# Patient Record
Sex: Male | Born: 1970 | Race: White | Hispanic: No | Marital: Single | Smoking: Former smoker
Health system: Southern US, Community
[De-identification: ages and names within clinical notes are randomized; demographics above are authoritative.]

## PROBLEM LIST (undated history)

## (undated) DIAGNOSIS — E785 Hyperlipidemia, unspecified: Secondary | ICD-10-CM

## (undated) DIAGNOSIS — R519 Headache, unspecified: Secondary | ICD-10-CM

## (undated) DIAGNOSIS — R011 Cardiac murmur, unspecified: Secondary | ICD-10-CM

## (undated) DIAGNOSIS — R0602 Shortness of breath: Secondary | ICD-10-CM

## (undated) DIAGNOSIS — I351 Nonrheumatic aortic (valve) insufficiency: Secondary | ICD-10-CM

## (undated) DIAGNOSIS — M1990 Unspecified osteoarthritis, unspecified site: Secondary | ICD-10-CM

## (undated) DIAGNOSIS — F419 Anxiety disorder, unspecified: Secondary | ICD-10-CM

## (undated) DIAGNOSIS — I350 Nonrheumatic aortic (valve) stenosis: Secondary | ICD-10-CM

## (undated) DIAGNOSIS — Z87442 Personal history of urinary calculi: Secondary | ICD-10-CM

## (undated) DIAGNOSIS — J302 Other seasonal allergic rhinitis: Secondary | ICD-10-CM

## (undated) DIAGNOSIS — M2540 Effusion, unspecified joint: Secondary | ICD-10-CM

## (undated) DIAGNOSIS — Z953 Presence of xenogenic heart valve: Secondary | ICD-10-CM

## (undated) DIAGNOSIS — I429 Cardiomyopathy, unspecified: Secondary | ICD-10-CM

## (undated) HISTORY — DX: Nonrheumatic aortic (valve) insufficiency: I35.1

## (undated) HISTORY — DX: Other seasonal allergic rhinitis: J30.2

## (undated) HISTORY — DX: Headache, unspecified: R51.9

## (undated) HISTORY — PX: LITHOTRIPSY: SUR834

## (undated) HISTORY — DX: Cardiomyopathy, unspecified: I42.9

## (undated) HISTORY — PX: BACK SURGERY: SHX140

## (undated) HISTORY — DX: Anxiety disorder, unspecified: F41.9

## (undated) HISTORY — DX: Nonrheumatic aortic (valve) stenosis: I35.0

## (undated) HISTORY — PX: OTHER SURGICAL HISTORY: SHX169

---

## 2000-12-01 ENCOUNTER — Ambulatory Visit (HOSPITAL_COMMUNITY): Admission: RE | Admit: 2000-12-01 | Discharge: 2000-12-01 | Payer: Self-pay | Admitting: Internal Medicine

## 2003-03-09 ENCOUNTER — Ambulatory Visit (HOSPITAL_BASED_OUTPATIENT_CLINIC_OR_DEPARTMENT_OTHER): Admission: RE | Admit: 2003-03-09 | Discharge: 2003-03-09 | Payer: Self-pay | Admitting: General Surgery

## 2003-12-02 ENCOUNTER — Emergency Department (HOSPITAL_COMMUNITY): Admission: EM | Admit: 2003-12-02 | Discharge: 2003-12-02 | Payer: Self-pay

## 2003-12-09 ENCOUNTER — Emergency Department (HOSPITAL_COMMUNITY): Admission: EM | Admit: 2003-12-09 | Discharge: 2003-12-09 | Payer: Self-pay | Admitting: Emergency Medicine

## 2005-12-04 ENCOUNTER — Encounter: Admission: RE | Admit: 2005-12-04 | Discharge: 2005-12-04 | Payer: Self-pay | Admitting: Internal Medicine

## 2006-07-01 ENCOUNTER — Ambulatory Visit (HOSPITAL_COMMUNITY): Admission: RE | Admit: 2006-07-01 | Discharge: 2006-07-01 | Payer: Self-pay | Admitting: Internal Medicine

## 2008-08-29 ENCOUNTER — Ambulatory Visit (HOSPITAL_COMMUNITY): Admission: RE | Admit: 2008-08-29 | Discharge: 2008-08-29 | Payer: Self-pay | Admitting: Urology

## 2010-05-05 ENCOUNTER — Emergency Department (HOSPITAL_COMMUNITY)
Admission: EM | Admit: 2010-05-05 | Discharge: 2010-05-05 | Disposition: A | Discharge status: Home / Self Care | Payer: BC Managed Care – PPO | Attending: Emergency Medicine | Admitting: Emergency Medicine

## 2010-05-05 ENCOUNTER — Emergency Department (HOSPITAL_COMMUNITY): Payer: BC Managed Care – PPO

## 2010-05-05 DIAGNOSIS — M5124 Other intervertebral disc displacement, thoracic region: Secondary | ICD-10-CM | POA: Insufficient documentation

## 2010-05-05 DIAGNOSIS — R109 Unspecified abdominal pain: Secondary | ICD-10-CM | POA: Insufficient documentation

## 2010-05-05 DIAGNOSIS — IMO0002 Reserved for concepts with insufficient information to code with codable children: Secondary | ICD-10-CM | POA: Insufficient documentation

## 2010-05-05 DIAGNOSIS — Z87442 Personal history of urinary calculi: Secondary | ICD-10-CM | POA: Insufficient documentation

## 2010-05-05 DIAGNOSIS — M549 Dorsalgia, unspecified: Secondary | ICD-10-CM | POA: Insufficient documentation

## 2010-05-05 LAB — POCT I-STAT, CHEM 8
Calcium, Ion: 1.18 mmol/L (ref 1.12–1.32)
Chloride: 108 mEq/L (ref 96–112)
HCT: 48 % (ref 39.0–52.0)
Hemoglobin: 16.3 g/dL (ref 13.0–17.0)
Potassium: 3.6 mEq/L (ref 3.5–5.1)

## 2010-05-05 LAB — URINALYSIS, ROUTINE W REFLEX MICROSCOPIC
Nitrite: NEGATIVE
Specific Gravity, Urine: 1.026 (ref 1.005–1.030)
Urobilinogen, UA: 0.2 mg/dL (ref 0.0–1.0)
pH: 5.5 (ref 5.0–8.0)

## 2010-05-06 LAB — URINE CULTURE
Colony Count: NO GROWTH
Culture  Setup Time: 201203172048

## 2010-07-06 NOTE — Op Note (Signed)
NAME:  CON, ARGANBRIGHT                      ACCOUNT NO.:  [id number]   MEDICAL RECORD NO.:  [id number]                   PATIENT TYPE:  AMB   LOCATION:  DSC                                  FACILITY:  [facility]   PHYSICIAN:  Gita Kudo, M.D.              DATE OF BIRTH:  1970/07/24   DATE OF PROCEDURE:  03/09/2003  DATE OF DISCHARGE:                                 OPERATIVE REPORT   OPERATIVE PROCEDURE:  Excision right buttock and intergluteal pilonidal  sinus.   SURGEON:  Gita Kudo, M.D.   ANESTHESIA:  1% Xylocaine.   PREOPERATIVE DIAGNOSIS:  Buttock lesion - right side.   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   CLINICAL SUMMARY:  40 year old male with a long history of a draining area  on his buttock and also in the midline.   OPERATIVE FINDINGS:  After shaving, I could see the small midline sinus.   OPERATIVE PROCEDURE:  The patient was placed in the prone position, prepped,  draped, and shaved in a standard fashion.  1% Xylocaine was infiltrated for  good analgesia.  A hemostat was placed in the sinus track but did not go  very far.  The indurated area in the right buttock was not fluctuant.  Then,  an elliptical incision was made encompassing the thickened tissue in the  right buttock and there was chronic inflammatory type tissue but no pus or  hair.  This was continued and extended into the midline on the sinus  opening.  As such, a hockey stick shaped incision was made.  Bleeding was  controlled with cautery and dressing.  No complications.  To be followed up  as an outpatient.                                               Gita Kudo, M.D.    MRL/MEDQ  D:  03/09/2003  T:  03/09/2003  Job:  [id number]

## 2011-06-01 ENCOUNTER — Emergency Department (HOSPITAL_COMMUNITY)
Admission: EM | Admit: 2011-06-01 | Discharge: 2011-06-01 | Disposition: A | Discharge status: Home / Self Care | Payer: BC Managed Care – PPO | Attending: Emergency Medicine | Admitting: Emergency Medicine

## 2011-06-01 ENCOUNTER — Encounter (HOSPITAL_COMMUNITY): Payer: Self-pay

## 2011-06-01 DIAGNOSIS — M545 Low back pain, unspecified: Secondary | ICD-10-CM | POA: Insufficient documentation

## 2011-06-01 DIAGNOSIS — X58XXXA Exposure to other specified factors, initial encounter: Secondary | ICD-10-CM | POA: Insufficient documentation

## 2011-06-01 DIAGNOSIS — R109 Unspecified abdominal pain: Secondary | ICD-10-CM | POA: Insufficient documentation

## 2011-06-01 DIAGNOSIS — R10819 Abdominal tenderness, unspecified site: Secondary | ICD-10-CM | POA: Insufficient documentation

## 2011-06-01 DIAGNOSIS — T148XXA Other injury of unspecified body region, initial encounter: Secondary | ICD-10-CM

## 2011-06-01 LAB — DIFFERENTIAL
Basophils Absolute: 0 10*3/uL (ref 0.0–0.1)
Eosinophils Relative: 2 % (ref 0–5)
Lymphocytes Relative: 30 % (ref 12–46)
Lymphs Abs: 2.3 10*3/uL (ref 0.7–4.0)
Monocytes Absolute: 0.7 10*3/uL (ref 0.1–1.0)
Monocytes Relative: 8 % (ref 3–12)
Neutro Abs: 4.7 10*3/uL (ref 1.7–7.7)

## 2011-06-01 LAB — CBC
HCT: 46.6 % (ref 39.0–52.0)
Hemoglobin: 15.8 g/dL (ref 13.0–17.0)
MCV: 88.6 fL (ref 78.0–100.0)
RDW: 13.4 % (ref 11.5–15.5)
WBC: 7.9 10*3/uL (ref 4.0–10.5)

## 2011-06-01 LAB — URINALYSIS, ROUTINE W REFLEX MICROSCOPIC
Glucose, UA: NEGATIVE mg/dL
Hgb urine dipstick: NEGATIVE
Leukocytes, UA: NEGATIVE
Protein, ur: NEGATIVE mg/dL
pH: 6 (ref 5.0–8.0)

## 2011-06-01 LAB — HEPATIC FUNCTION PANEL
Albumin: 4.3 g/dL (ref 3.5–5.2)
Alkaline Phosphatase: 64 U/L (ref 39–117)
Total Protein: 7.8 g/dL (ref 6.0–8.3)

## 2011-06-01 LAB — BASIC METABOLIC PANEL
BUN: 12 mg/dL (ref 6–23)
CO2: 24 mEq/L (ref 19–32)
Calcium: 9.7 mg/dL (ref 8.4–10.5)
Chloride: 105 mEq/L (ref 96–112)
Creatinine, Ser: 0.91 mg/dL (ref 0.50–1.35)
Glucose, Bld: 102 mg/dL — ABNORMAL HIGH (ref 70–99)

## 2011-06-01 MED ORDER — HYDROCODONE-ACETAMINOPHEN 5-325 MG PO TABS: 2.0000 | ORAL_TABLET | ORAL | Status: AC | PRN

## 2011-06-01 MED ORDER — IBUPROFEN 600 MG PO TABS: 600.0000 mg | ORAL_TABLET | Freq: Three times a day (TID) | ORAL | Status: AC | PRN

## 2011-06-01 MED ORDER — KETOROLAC TROMETHAMINE 30 MG/ML IJ SOLN: 30.0000 mg | Freq: Once | INTRAMUSCULAR | Status: DC

## 2011-06-01 MED ORDER — ONDANSETRON HCL 4 MG/2ML IJ SOLN: 4.0000 mg | Freq: Once | INTRAMUSCULAR | Status: DC

## 2011-06-01 MED ORDER — HYDROMORPHONE HCL PF 1 MG/ML IJ SOLN: 1.0000 mg | Freq: Once | INTRAMUSCULAR | Status: DC

## 2011-06-01 NOTE — Discharge Instructions (Signed)
Back Pain, Adult Low back pain is very common. About 1 in 5 people have back pain.The cause of low back pain is rarely dangerous. The pain often gets better over time.About half of people with a sudden onset of back pain feel better in just 2 weeks. About 8 in 10 people feel better by 6 weeks.  CAUSES Some common causes of back pain include:  Strain of the muscles or ligaments supporting the spine.   Wear and tear (degeneration) of the spinal discs.   Arthritis.   Direct injury to the back.  DIAGNOSIS Most of the time, the direct cause of low back pain is not known.However, back pain can be treated effectively even when the exact cause of the pain is unknown.Answering your caregiver's questions about your overall health and symptoms is one of the most accurate ways to make sure the cause of your pain is not dangerous. If your caregiver needs more information, he or she may order lab work or imaging tests (X-rays or MRIs).However, even if imaging tests show changes in your back, this usually does not require surgery. HOME CARE INSTRUCTIONS For many people, back pain returns.Since low back pain is rarely dangerous, it is often a condition that people can learn to [facility] their own.   Remain active. It is stressful on the back to sit or stand in one place. Do not sit, drive, or stand in one place for more than 30 minutes at a time. Take short walks on level surfaces as soon as pain allows.Try to increase the length of time you walk each day.   Do not stay in bed.Resting more than 1 or 2 days can delay your recovery.   Do not avoid exercise or work.Your body is made to move.It is not dangerous to be active, even though your back may hurt.Your back will likely heal faster if you return to being active before your pain is gone.   Pay attention to your body when you bend and lift. Many people have less discomfortwhen lifting if they bend their knees, keep the load close to their  bodies,and avoid twisting. Often, the most comfortable positions are those that put less stress on your recovering back.   Find a comfortable position to sleep. Use a firm mattress and lie on your side with your knees slightly bent. If you lie on your back, put a pillow under your knees.   Only take over-the-counter or prescription medicines as directed by your caregiver. Over-the-counter medicines to reduce pain and inflammation are often the most helpful.Your caregiver may prescribe muscle relaxant drugs.These medicines help dull your pain so you can more quickly return to your normal activities and healthy exercise.   Put ice on the injured area.   Put ice in a plastic bag.   Place a towel between your skin and the bag.   Leave the ice on for 15 to 20 minutes, 3 to 4 times a day for the first 2 to 3 days. After that, ice and heat may be alternated to reduce pain and spasms.   Ask your caregiver about trying back exercises and gentle massage. This may be of some benefit.   Avoid feeling anxious or stressed.Stress increases muscle tension and can worsen back pain.It is important to recognize when you are anxious or stressed and learn ways to manage it.Exercise is a great option.  SEEK MEDICAL CARE IF:  You have pain that is not relieved with rest or medicine.   You have  relieved with rest or medicine.   You have pain that does not improve in 1 week.   You have new symptoms.   You are generally not feeling well.  SEEK IMMEDIATE MEDICAL CARE IF:    You have pain that radiates from your back into your legs.   You develop new bowel or bladder control problems.   You have unusual weakness or numbness in your arms or legs.   You develop nausea or vomiting.   You develop abdominal pain.   You feel faint.  Document Released: 02/04/2005 Document Revised: 01/24/2011 Document Reviewed: 06/25/2010  [employer] Patient Information 2012 [employer]    Lumbosacral Strain  Lumbosacral strain is one of the most common causes of back pain. There are many  causes of back pain. Most are not serious conditions.  CAUSES   Your backbone (spinal column) is made up of 24 main vertebral bodies, the sacrum, and the coccyx. These are held together by muscles and tough, fibrous tissue (ligaments). Nerve roots pass through the openings between the vertebrae. A sudden move or injury to the back may cause injury to, or pressure on, these nerves. This may result in localized back pain or pain movement (radiation) into the buttocks, down the leg, and into the foot. Sharp, shooting pain from the buttock down the back of the leg (sciatica) is frequently associated with a ruptured (herniated) disk. Pain may be caused by muscle spasm alone.  Your caregiver can often find the cause of your pain by the details of your symptoms and an exam. In some cases, you may need tests (such as X-rays). Your caregiver will work with you to decide if any tests are needed based on your specific exam.  HOME CARE INSTRUCTIONS    Avoid an underactive lifestyle. Active exercise, as directed by your caregiver, is your greatest weapon against back pain.   Avoid hard physical activities (tennis, racquetball, waterskiing) if you are not in proper physical condition for it. This may aggravate or create problems.   If you have a back problem, avoid sports requiring sudden body movements. Swimming and walking are generally safer activities.   Maintain good posture.   Avoid becoming overweight (obese).   Use bed rest for only the most extreme, sudden (acute) episode. Your caregiver will help you determine how much bed rest is necessary.   For acute conditions, you may put ice on the injured area.   Put ice in a plastic bag.   Place a towel between your skin and the bag.   Leave the ice on for 15 to 20 minutes at a time, every 2 hours, or as needed.   After you are improved and more active, it may help to apply heat for 30 minutes before activities.  See your caregiver if you are having pain that lasts  longer than expected. Your caregiver can advise appropriate exercises or therapy if needed. With conditioning, most back problems can be avoided.  SEEK IMMEDIATE MEDICAL CARE IF:    You have numbness, tingling, weakness, or problems with the use of your arms or legs.   You experience severe back pain not relieved with medicines.   There is a change in bowel or bladder control.   You have increasing pain in any area of the body, including your belly (abdomen).   You notice shortness of breath, dizziness, or feel faint.   You feel sick to your stomach (nauseous), are throwing up (vomiting), or become sweaty.   You   You have a fever.  MAKE SURE YOU:   Understand these instructions.   Will watch your condition.   Will get help right away if you are not doing well or get worse.  Document Released: 11/14/2004 Document Revised: 01/24/2011 Document Reviewed: 05/06/2008 [facility] Patient Information 2012 [place], [place].Pain of Unknown Etiology (Pain Without a Known Cause) You have come to your caregiver because of pain. Pain can occur in any part of the body. Often there is not a definite cause. If your laboratory (blood or urine) work was normal and x-rays or other studies were normal, your caregiver may treat you without knowing the cause of the pain. An example of this is the headache. Most headaches are diagnosed by taking a history. This means your caregiver asks you questions about your headaches. Your caregiver determines a treatment based on your answers. Usually testing done for headaches is normal. Often testing is not done unless there is no response to medications. Regardless of where your pain is located today, you can be given medications to make you comfortable. If no physical cause of pain can be found, most cases of pain will gradually leave as  suddenly as they came.  If you have a painful condition and no reason can be found for the pain, It is importantthat you follow up with your caregiver. If the pain becomes worse or does not go away, it may be necessary to repeat tests and look further for a possible cause.  Only take over-the-counter or prescription medicines for pain, discomfort, or fever as directed by your caregiver.   For the protection of your privacy, test results can not be given over the phone. Make sure you receive the results of your test. Ask as to how these results are to be obtained if you have not been informed. It is your responsibility to obtain your test results.   You may continue all activities unless the activities cause more pain. When the pain lessens, it is important to gradually resume normal activities. Resume activities by beginning slowly and gradually increasing the intensity and duration of the activities or exercise. During periods of severe pain, bed-rest may be helpful. Lay or sit in any position that is comfortable.   Ice used for acute (sudden) conditions may be effective. Use a large plastic bag filled with ice and wrapped in a towel. This may provide pain relief.   See your caregiver for continued problems. They can help or refer you for exercises or physical therapy if necessary.  If you were given medications for your condition, do not drive, operate machinery or power tools, or sign legal documents for 24 hours. Do not drink alcohol, take sleeping pills, or take other medications that may interfere with treatment. See your caregiver immediately if you have pain that is becoming worse and not relieved by medications. Document Released: 10/30/2000 Document Revised: 01/24/2011 Document Reviewed: 02/04/2005 [facility] Patient Information 2012 [place], [place].Pain of Unknown Etiology (Pain Without a Known Cause) You have come to your caregiver because of pain. Pain can occur in any part of the body.  Often there is not a definite cause. If your laboratory (blood or urine) work was normal and x-rays or other studies were normal, your caregiver may treat you without knowing the cause of the pain. An example of this is the headache. Most headaches are diagnosed by taking a history. This means your caregiver asks you questions about your headaches. Your caregiver determines a treatment based on your answers. Usually  testing done for headaches is normal. Often testing is not done unless there is no response to medications. Regardless of where your pain is located today, you can be given medications to make you comfortable. If no physical cause of pain can be found, most cases of pain will gradually leave as suddenly as they came.  If you have a painful condition and no reason can be found for the pain, It is importantthat you follow up with your caregiver. If the pain becomes worse or does not go away, it may be necessary to repeat tests and look further for a possible cause.  Only take over-the-counter or prescription medicines for pain, discomfort, or fever as directed by your caregiver.   For the protection of your privacy, test results can not be given over the phone. Make sure you receive the results of your test. Ask as to how these results are to be obtained if you have not been informed. It is your responsibility to obtain your test results.   You may continue all activities unless the activities cause more pain. When the pain lessens, it is important to gradually resume normal activities. Resume activities by beginning slowly and gradually increasing the intensity and duration of the activities or exercise. During periods of severe pain, bed-rest may be helpful. Lay or sit in any position that is comfortable.   Ice used for acute (sudden) conditions may be effective. Use a large plastic bag filled with ice and wrapped in a towel. This may provide pain relief.   See your caregiver for continued  problems. They can help or refer you for exercises or physical therapy if necessary.  If you were given medications for your condition, do not drive, operate machinery or power tools, or sign legal documents for 24 hours. Do not drink alcohol, take sleeping pills, or take other medications that may interfere with treatment. See your caregiver immediately if you have pain that is becoming worse and not relieved by medications. Document Released: 10/30/2000 Document Revised: 01/24/2011 Document Reviewed: 02/04/2005 [facility] Patient Information 2012 [place], [place]. RESOURCE GUIDE  Dental Problems  Patients with Medicaid: [facility] [phone] [address]                                           [phone] [address] Phone:  [phone]                                                  Phone:  [phone]  If unable to pay or uninsured, contact:  [facility] or [facility] to become qualified for the adult dental clinic.  Chronic Pain Problems Contact [facility]  [phone] Patients need to be referred by their primary care doctor.  Insufficient Money for Medicine Contact [employer]:  call "211" or [facility] 206-686-3825.  No Primary Care Doctor Call [facility]  [phone] Other agencies that provide inexpensive medical care    [facility]  [phone]    [facility]  [phone]    [facility]  ([phone]  Women's Clinic  [phone]    [facility]  (608) [phone]    [facility]  [phone]  Psychological Services [facility]  [phone] [facility]  [phone] [facility]   [phone] (emergency services [phone])  Substance Abuse Resources [facility]  639-115-2662 [facility] 616-282-9347 [facility] [place] ([phone] Floydene Flock [phone] Residential & Outpatient Substance  Abuse Program  610-084-6581  Abuse/Neglect [facility] Child Abuse Hotline (513)834-6764 [facility] Child Abuse Hotline 867 225 9641 (After Hours)  Emergency Shelter [facility] [phone]  Maternity Homes [facility] at the [place] [facility] [phone] [facility] [phone]  MRSA Hotline #:   581-212-3692    [facility] Resources  [facility] [place]     [employer]                          [facility] Dept. [address] [place]                       [address]      [address]  Blondell Reveal Phone:  [phone]                                   Phone:  [phone]                 Phone:  [phone]  [facility] Phone:  [phone]  [facility] Child Abuse Hotline 365-475-1832 (303) [phone] (After Hours)

## 2011-06-01 NOTE — ED Notes (Signed)
Pt states pain in low right flank x 1 wk.  Hx of kidney stones.  No difficulty urinating.

## 2011-06-01 NOTE — ED Notes (Signed)
Pt refused IV pain meds.  

## 2011-06-01 NOTE — ED Provider Notes (Signed)
History     CSN: [id number]  Arrival date & time 06/01/11  1015   First MD Initiated Contact with Patient 06/01/11 1043      Chief Complaint  Patient presents with  . Flank Pain    right side x1 week    (Consider location/radiation/quality/duration/timing/severity/associated sxs/prior treatment) Patient is a 41 y.o. male presenting with flank pain. The history is provided by the patient and medical records.  Flank Pain This is a recurrent problem. The current episode started more than 1 week ago. The problem occurs constantly. The problem has not changed since onset.Associated symptoms include abdominal pain. Pertinent negatives include no chest pain, no headaches and no shortness of breath. The symptoms are aggravated by nothing (Not worsened or improved by urination, eating, bowel movement, or certain positions.). The symptoms are relieved by nothing. He has tried nothing for the symptoms.    History reviewed. No pertinent past medical history.  Past Surgical History  Procedure Date  . Lithotripsy     No family history on file.  History  Substance Use Topics  . Smoking status: Current Some Day Smoker  . Smokeless tobacco: Not on file  . Alcohol Use: Yes      Review of Systems  Constitutional: Negative for fever, chills, activity change, appetite change and fatigue.  HENT: Negative.   Eyes: Negative.   Respiratory: Negative for cough, shortness of breath and wheezing.   Cardiovascular: Negative for chest pain.  Gastrointestinal: Positive for abdominal pain. Negative for nausea, vomiting, diarrhea, constipation, blood in stool, abdominal distention, anal bleeding and rectal pain.  Genitourinary: Positive for flank pain. Negative for dysuria, urgency, frequency, hematuria, decreased urine volume, discharge, genital sores, penile pain and testicular pain.  Musculoskeletal: Negative.   Skin: Negative for color change and rash.  Neurological: Negative for light-headedness  and headaches.  Hematological: Does not bruise/bleed easily.  Psychiatric/Behavioral: Negative.     Allergies  Prednisone and Serzone  Home Medications   Current Outpatient Rx  Name Route Sig Dispense Refill  . ALPRAZOLAM 0.5 MG PO TABS Oral Take 0.5 mg by mouth at bedtime as needed. anxiety    . FLUTICASONE PROPIONATE 50 MCG/ACT NA SUSP Nasal Place 2 sprays into the nose daily.    . IBUPROFEN 200 MG PO TABS Oral Take 200 mg by mouth every 6 (six) hours as needed. pain    . HYDROCODONE-ACETAMINOPHEN 5-325 MG PO TABS Oral Take 2 tablets by mouth every 4 (four) hours as needed for pain. 20 tablet 0  . IBUPROFEN 600 MG PO TABS Oral Take 1 tablet (600 mg total) by mouth every 8 (eight) hours as needed for pain. 20 tablet 0    BP 131/88  Pulse 76  Temp(Src) 97.8 F (36.6 C) (Oral)  Resp 18  SpO2 99%  Physical Exam  Nursing note and vitals reviewed. Constitutional: He is oriented to person, place, and time. He appears well-nourished. No distress.  HENT:  Head: Normocephalic and atraumatic.  Eyes: EOM are normal. Pupils are equal, round, and reactive to light.  Neck: Normal range of motion. Neck supple. No JVD present.  Cardiovascular: Normal rate, regular rhythm, normal heart sounds and intact distal pulses.  Exam reveals no gallop and no friction rub.   No murmur heard. Pulmonary/Chest: Effort normal and breath sounds normal. No respiratory distress. He has no wheezes. He has no rales. He exhibits no tenderness.  Abdominal: Soft. Bowel sounds are normal. He exhibits no distension, no fluid wave, no ascites and no  mass. There is no hepatosplenomegaly. There is tenderness in the right upper quadrant, right lower quadrant, left upper quadrant and left lower quadrant. There is CVA tenderness. There is no rigidity, no rebound, no guarding, no tenderness at McBurney's point and negative Murphy's sign.       Mild right CVA tenderness to palpation  Musculoskeletal: Normal range of motion. He  exhibits no edema and no tenderness.       Lumbar back: He exhibits tenderness and pain. He exhibits normal range of motion, no bony tenderness, no deformity and no spasm.       Back:  Neurological: He is alert and oriented to person, place, and time. He has normal strength and normal reflexes. He displays no tremor. No cranial nerve deficit or sensory deficit. He exhibits normal muscle tone. Coordination and gait normal. GCS eye subscore is 4. GCS verbal subscore is 5. GCS motor subscore is 6.  Reflex Scores:      Patellar reflexes are 2+ on the right side and 2+ on the left side. Skin: Skin is warm and dry. No rash noted. He is not diaphoretic. No erythema. No pallor.  Psychiatric: He has a normal mood and affect. His behavior is normal. Judgment and thought content normal.    ED Course  Procedures (including critical care time)  Labs Reviewed  BASIC METABOLIC PANEL - Abnormal; Notable for the following:    Glucose, Bld 102 (*)    All other components within normal limits  URINALYSIS, ROUTINE W REFLEX MICROSCOPIC  CBC  DIFFERENTIAL  LIPASE, BLOOD  HEPATIC FUNCTION PANEL  URINE CULTURE   No results found.   1. Low back pain   2. Muscle strain       MDM  Renal colic, urinary tract infection, muscle strain of low back, biliary colic (cholecystitis/cholelithiasis), pancreatitis all entertained in the differential diagnosis.  History/exam not suggestive of bowel obstruction or appendicitis.  Review of diagnostic testing is suggestive of lumbar strain/muscular lumbar pain.         Felisa Bonier, MD 06/01/11 Mikle Bosworth

## 2011-06-01 NOTE — ED Notes (Signed)
Pt in from home with right flank pain x1 week denies difficulty urinating hx of kidney stones

## 2011-06-02 LAB — URINE CULTURE
Colony Count: NO GROWTH
Culture: NO GROWTH

## 2012-08-05 ENCOUNTER — Encounter (INDEPENDENT_AMBULATORY_CARE_PROVIDER_SITE_OTHER): Payer: BC Managed Care – PPO

## 2012-08-05 DIAGNOSIS — R42 Dizziness and giddiness: Secondary | ICD-10-CM

## 2012-08-05 DIAGNOSIS — H35029 Exudative retinopathy, unspecified eye: Secondary | ICD-10-CM

## 2012-08-05 DIAGNOSIS — H547 Unspecified visual loss: Secondary | ICD-10-CM

## 2012-08-07 ENCOUNTER — Telehealth: Payer: Self-pay | Admitting: Neurology

## 2012-08-07 NOTE — Telephone Encounter (Signed)
I  received a phone call from Dr.  Link Snuffer, patient had MRI recently, consistent with MS.  1. Annabelle Harman, please contact him for an appointment in 1-2 weeks. 2. Advise him to get MRI CD from [facility].

## 2012-08-10 NOTE — Telephone Encounter (Signed)
Called patient and spoke to him he will see Dr.Yan this coming Wednesday and he will bring MRI cd's.

## 2012-08-12 ENCOUNTER — Ambulatory Visit: Payer: Self-pay | Admitting: Neurology

## 2012-08-12 ENCOUNTER — Ambulatory Visit (HOSPITAL_COMMUNITY): Payer: BC Managed Care – PPO | Attending: Cardiovascular Disease | Admitting: Radiology

## 2012-08-12 ENCOUNTER — Other Ambulatory Visit (HOSPITAL_COMMUNITY): Payer: Self-pay | Admitting: Internal Medicine

## 2012-08-12 DIAGNOSIS — I379 Nonrheumatic pulmonary valve disorder, unspecified: Secondary | ICD-10-CM | POA: Insufficient documentation

## 2012-08-12 DIAGNOSIS — Q231 Congenital insufficiency of aortic valve: Secondary | ICD-10-CM | POA: Insufficient documentation

## 2012-08-12 DIAGNOSIS — I080 Rheumatic disorders of both mitral and aortic valves: Secondary | ICD-10-CM | POA: Insufficient documentation

## 2012-08-12 DIAGNOSIS — R42 Dizziness and giddiness: Secondary | ICD-10-CM | POA: Insufficient documentation

## 2012-08-12 DIAGNOSIS — Q2381 Bicuspid aortic valve: Secondary | ICD-10-CM

## 2012-08-12 DIAGNOSIS — H5460 Unqualified visual loss, one eye, unspecified: Secondary | ICD-10-CM | POA: Insufficient documentation

## 2012-08-12 DIAGNOSIS — I359 Nonrheumatic aortic valve disorder, unspecified: Secondary | ICD-10-CM

## 2012-08-12 NOTE — Progress Notes (Signed)
Echocardiogram performed.  

## 2012-08-26 ENCOUNTER — Encounter (HOSPITAL_COMMUNITY): Payer: Self-pay | Admitting: Pharmacy Technician

## 2012-09-01 ENCOUNTER — Ambulatory Visit (HOSPITAL_COMMUNITY)
Admission: RE | Admit: 2012-09-01 | Discharge: 2012-09-01 | Disposition: A | Discharge status: Home / Self Care | Payer: BC Managed Care – PPO | Source: Ambulatory Visit | Attending: Cardiology | Admitting: Cardiology

## 2012-09-01 ENCOUNTER — Encounter (HOSPITAL_COMMUNITY)
Admission: RE | Disposition: A | Discharge status: Home / Self Care | Payer: Self-pay | Source: Ambulatory Visit | Attending: Cardiology

## 2012-09-01 ENCOUNTER — Encounter (HOSPITAL_COMMUNITY): Payer: Self-pay | Admitting: *Deleted

## 2012-09-01 DIAGNOSIS — I359 Nonrheumatic aortic valve disorder, unspecified: Secondary | ICD-10-CM | POA: Insufficient documentation

## 2012-09-01 DIAGNOSIS — F411 Generalized anxiety disorder: Secondary | ICD-10-CM | POA: Insufficient documentation

## 2012-09-01 DIAGNOSIS — I428 Other cardiomyopathies: Secondary | ICD-10-CM | POA: Insufficient documentation

## 2012-09-01 DIAGNOSIS — Z79899 Other long term (current) drug therapy: Secondary | ICD-10-CM | POA: Insufficient documentation

## 2012-09-01 DIAGNOSIS — Z7982 Long term (current) use of aspirin: Secondary | ICD-10-CM | POA: Insufficient documentation

## 2012-09-01 DIAGNOSIS — J309 Allergic rhinitis, unspecified: Secondary | ICD-10-CM | POA: Insufficient documentation

## 2012-09-01 DIAGNOSIS — Z808 Family history of malignant neoplasm of other organs or systems: Secondary | ICD-10-CM | POA: Insufficient documentation

## 2012-09-01 DIAGNOSIS — F17200 Nicotine dependence, unspecified, uncomplicated: Secondary | ICD-10-CM | POA: Insufficient documentation

## 2012-09-01 HISTORY — PX: TEE WITHOUT CARDIOVERSION: SHX5443

## 2012-09-01 HISTORY — DX: Cardiac murmur, unspecified: R01.1

## 2012-09-01 SURGERY — ECHOCARDIOGRAM, TRANSESOPHAGEAL
Anesthesia: Moderate Sedation

## 2012-09-01 MED ORDER — DIPHENHYDRAMINE HCL 50 MG/ML IJ SOLN
INTRAMUSCULAR | Status: AC
  Filled 2012-09-01: qty 1

## 2012-09-01 MED ORDER — PROMETHAZINE HCL 25 MG/ML IJ SOLN
INTRAMUSCULAR | Status: DC | PRN
  Administered 2012-09-01: 12.5 mg via INTRAVENOUS

## 2012-09-01 MED ORDER — SODIUM CHLORIDE 0.9 % IV SOLN
INTRAVENOUS | Status: DC
  Administered 2012-09-01: 14:00:00 via INTRAVENOUS

## 2012-09-01 MED ORDER — BUTAMBEN-TETRACAINE-BENZOCAINE 2-2-14 % EX AERO
INHALATION_SPRAY | CUTANEOUS | Status: DC | PRN
  Administered 2012-09-01: 2 via TOPICAL

## 2012-09-01 MED ORDER — FENTANYL CITRATE 0.05 MG/ML IJ SOLN
INTRAMUSCULAR | Status: DC | PRN
  Administered 2012-09-01: 50 ug via INTRAVENOUS

## 2012-09-01 MED ORDER — FENTANYL CITRATE 0.05 MG/ML IJ SOLN
INTRAMUSCULAR | Status: AC
  Filled 2012-09-01: qty 2

## 2012-09-01 MED ORDER — MIDAZOLAM HCL 5 MG/ML IJ SOLN
INTRAMUSCULAR | Status: AC
  Filled 2012-09-01: qty 2

## 2012-09-01 MED ORDER — PROMETHAZINE HCL 25 MG/ML IJ SOLN
INTRAMUSCULAR | Status: AC
  Filled 2012-09-01: qty 1

## 2012-09-01 MED ORDER — MIDAZOLAM HCL 10 MG/2ML IJ SOLN
INTRAMUSCULAR | Status: DC | PRN
  Administered 2012-09-01 (×2): 2 mg via INTRAVENOUS
  Administered 2012-09-01: 1 mg via INTRAVENOUS

## 2012-09-01 NOTE — Interval H&P Note (Signed)
History and Physical Interval Note:  09/01/2012 1:22 PM  Bruce [name]  has presented today for surgery, with the diagnosis of AORTIC STENOSIS  The various methods of treatment have been discussed with the patient and family. After consideration of risks, benefits and other options for treatment, the patient has consented to  Procedure(s) with comments: TRANSESOPHAGEAL ECHOCARDIOGRAM (TEE) (N/A) - h&p in file-[facility] as a surgical intervention .  The patient's history has been reviewed, patient examined, no change in status, stable for surgery.  I have reviewed the patient's chart and labs.  Questions were answered to the patient's satisfaction.     Pamella Pert

## 2012-09-01 NOTE — Progress Notes (Signed)
*  PRELIMINARY RESULTS* Echocardiogram TEE has been performed.  Bruce [name] 09/01/2012, 3:31 PM

## 2012-09-01 NOTE — H&P (Signed)
  Please see office visit notes for complete details of HPI.  

## 2012-09-01 NOTE — CV Procedure (Signed)
Probably bicuspid aortic valve with severe AS. LV mildly dilated with severe LV systolic dysfunction. EF 30-35%.

## 2012-09-02 ENCOUNTER — Encounter (HOSPITAL_COMMUNITY): Payer: Self-pay | Admitting: Cardiology

## 2012-09-08 ENCOUNTER — Encounter (HOSPITAL_COMMUNITY)
Admission: RE | Disposition: A | Discharge status: Home / Self Care | Payer: Self-pay | Source: Ambulatory Visit | Attending: Cardiology

## 2012-09-08 ENCOUNTER — Ambulatory Visit (HOSPITAL_COMMUNITY)
Admission: RE | Admit: 2012-09-08 | Discharge: 2012-09-08 | Disposition: A | Discharge status: Home / Self Care | Payer: BC Managed Care – PPO | Source: Ambulatory Visit | Attending: Cardiology | Admitting: Cardiology

## 2012-09-08 DIAGNOSIS — I519 Heart disease, unspecified: Secondary | ICD-10-CM | POA: Insufficient documentation

## 2012-09-08 DIAGNOSIS — Q231 Congenital insufficiency of aortic valve: Secondary | ICD-10-CM | POA: Insufficient documentation

## 2012-09-08 DIAGNOSIS — I428 Other cardiomyopathies: Secondary | ICD-10-CM | POA: Insufficient documentation

## 2012-09-08 HISTORY — PX: LEFT AND RIGHT HEART CATHETERIZATION WITH CORONARY ANGIOGRAM: SHX5449

## 2012-09-08 LAB — POCT I-STAT 3, VENOUS BLOOD GAS (G3P V)
Bicarbonate: 24.3 mEq/L — ABNORMAL HIGH (ref 20.0–24.0)
pCO2, Ven: 42.6 mmHg — ABNORMAL LOW (ref 45.0–50.0)
pH, Ven: 7.38 — ABNORMAL HIGH (ref 7.250–7.300)
pH, Ven: 7.382 — ABNORMAL HIGH (ref 7.250–7.300)
pO2, Ven: 36 mmHg (ref 30.0–45.0)

## 2012-09-08 LAB — POCT I-STAT 3, ART BLOOD GAS (G3+)
O2 Saturation: 97 %
TCO2: 24 mmol/L (ref 0–100)
pCO2 arterial: 36.4 mmHg (ref 35.0–45.0)
pO2, Arterial: 91 mmHg (ref 80.0–100.0)

## 2012-09-08 SURGERY — LEFT AND RIGHT HEART CATHETERIZATION WITH CORONARY ANGIOGRAM
Anesthesia: LOCAL

## 2012-09-08 MED ORDER — SODIUM CHLORIDE 0.9 % IV SOLN: 1.0000 mL/kg/h | INTRAVENOUS | Status: DC

## 2012-09-08 MED ORDER — HEPARIN (PORCINE) IN NACL 2-0.9 UNIT/ML-% IJ SOLN
INTRAMUSCULAR | Status: AC
  Filled 2012-09-08: qty 1000

## 2012-09-08 MED ORDER — HYDROMORPHONE HCL PF 2 MG/ML IJ SOLN
INTRAMUSCULAR | Status: AC
  Filled 2012-09-08: qty 1

## 2012-09-08 MED ORDER — SODIUM CHLORIDE 0.9 % IV SOLN: 250.0000 mL | INTRAVENOUS | Status: DC | PRN

## 2012-09-08 MED ORDER — ONDANSETRON HCL 4 MG/2ML IJ SOLN: 4.0000 mg | Freq: Four times a day (QID) | INTRAMUSCULAR | Status: DC | PRN

## 2012-09-08 MED ORDER — MIDAZOLAM HCL 2 MG/2ML IJ SOLN
INTRAMUSCULAR | Status: AC
  Filled 2012-09-08: qty 2

## 2012-09-08 MED ORDER — HEPARIN SODIUM (PORCINE) 1000 UNIT/ML IJ SOLN
INTRAMUSCULAR | Status: AC
  Filled 2012-09-08: qty 1

## 2012-09-08 MED ORDER — SODIUM CHLORIDE 0.9 % IJ SOLN: 3.0000 mL | Freq: Two times a day (BID) | INTRAMUSCULAR | Status: DC

## 2012-09-08 MED ORDER — VERAPAMIL HCL 2.5 MG/ML IV SOLN
INTRAVENOUS | Status: AC
  Filled 2012-09-08: qty 2

## 2012-09-08 MED ORDER — SODIUM CHLORIDE 0.9 % IJ SOLN: 3.0000 mL | INTRAMUSCULAR | Status: DC | PRN

## 2012-09-08 MED ORDER — ASPIRIN 81 MG PO CHEW
324.0000 mg | CHEWABLE_TABLET | ORAL | Status: AC
Start: 2012-09-09 — End: 2012-09-08
  Administered 2012-09-08: 324 mg via ORAL
  Filled 2012-09-08: qty 4

## 2012-09-08 MED ORDER — NITROGLYCERIN 0.2 MG/ML ON CALL CATH LAB
INTRAVENOUS | Status: AC
  Filled 2012-09-08: qty 1

## 2012-09-08 MED ORDER — SODIUM CHLORIDE 0.9 % IV SOLN
INTRAVENOUS | Status: DC
  Administered 2012-09-08: 10:00:00 via INTRAVENOUS

## 2012-09-08 MED ORDER — LIDOCAINE HCL (PF) 1 % IJ SOLN
INTRAMUSCULAR | Status: AC
  Filled 2012-09-08: qty 30

## 2012-09-08 MED ORDER — ACETAMINOPHEN 325 MG PO TABS: 650.0000 mg | ORAL_TABLET | ORAL | Status: DC | PRN

## 2012-09-08 NOTE — H&P (View-Only) (Signed)
  Please see office visit notes for complete details of HPI.  

## 2012-09-08 NOTE — CV Procedure (Signed)
Procedures performed: Right and left heart catheterization and occlusion of cardiac output and cardiac index by Fick. Right radial arterial access for left heart catheterization and right antecubital vein access for right heart catheterization was utilized for performing the procedure.   Indication: Patient is a 42 year-old , male with recently diagnosed cardiomyopathy with severe LV systolic dysfunction, severe aortic stenosis, bicuspid aortic valve.  Due to symptomatic severe aortic stenosis, patient underwent TEE, which confirmed severe bicuspid aortic valve stenosis and mild to moderate aortic regurgitation.  Hence was scheduled for left and right heart catheterization for preoperative cardiac evaluation.  Ascending aortogram and descending thoracic aortogram was performed to evaluate for presence of aneurysm, coarctation of the aorta, to evaluate surgical feasibility for lateral thoracotomy approach for aortic valve replacement.  Procedural data:  RA pressure 10/9  Mean 7 mm mercury. RA saturation 67%.  RV pressure 25/5 and Right ventricular EDP 9 mm Hg. PA pressure 21/12 with a mean of 16  mm mercury. PA saturation 70%.  Pulmonary capillary wedge 15/15 with a mean of 13 mm Hg. Aortic saturation 97%.  Cardiac output was 4.43 with cardiac index of 2.01  by Fick.  Aortic valve area cavitated at 1.12 cm2.  Peak to peak gradient was 44 mmHg, mean gradient was 25.5 mmHg.  Left heart catheterization hemodynamic data: Left ventricle pressure 156/9 with end-diastolic pressure of 15 mmHg.aortic pressure was 131/75 with a mean of 99 mmHg. Aortic valve area cavitated at 1.12 cm2.  Peak to peak gradient was 44 mmHg, mean gradient was 25.5 mmHg.  Angiographic data  Left ventricle:  performed.  Left systolic shows normal ejection fraction of 35-40% The left ventricle is dilated moderately.  Right coronary artery: Smooth, dominant and normal. Large proximal RV branch noted.  Left main coronary artery:  Normal. No stenosis.   LAD: Large, smooth and normal. Gives origin to a moderate sized diagonal 1 which is smooth and normal.   Circumflex coronary artery: Smooth normal. Non-dominant.  Ascending aortogram/ascending thoracic aortogram: Moderate aortic valve calcification evident.  Moderate aortic regurgitation. Descending thoracic and abdominal aorta reveals no evidence of abdominal aortic aneurysm or aortic coarctation.  Impression: Normal left and right  heart catheterizaton with normal coronary arteries.  Moderate aortic regurgitation, moderate aortic stenosis with moderate to severe LV systolic dysfunction, LV dilatation.  Congenital bicuspid aortic valve without any evidence of ascending or descending thoracic or abdominal aortic aneurysm.  Recommendation:  As patient has left ventricular systolic dysfunction, patient needs identified replacement.  Patient will be referred for cardio-thoracic surgical consultation.  Technique:  A 5 French brachial sheath introduced into right AC vein access. A 5 French Swan-Ganz catheter was advanced with balloon inflated on the sheath under fluoroscopic guidance into first the right atrium followed by the right ventricle and into the pulmonary artery to pulmonary artery wedge position. Hemodynamics were obtained in a locations. After hemodynamics were completed, samples were taken for SaO2% measurement to be used in [facility] /Index catheterization. The catheter was then pulled back the balloon down and then completely out of the body.   Left Heart Catheterization   First a 5 [place] TIG 4 catheter was advanced over standard J-wire into the ascending aorta and used to engage first the Left and Right Coronary Artery. Multiple cineangiographic views of the Left then Right Coronary Artery system(s) were performed.  A 5  French angled pigtail catheter which was used to cross the aortic valve for measurement Left Ventricular Hemodynamics. I utilized a 0.035 inch  versacore wire for crossing the aortic valve. Left ventriculography was then performed in the RAO projection. Hemodynamics were then resampled and the catheter pulled back across the aortic valve for measurement of "pullback" gradient. The catheter was then removed the body over wire. All exchanges were made over standard J wire. Hemostasis was obtained by applying TR band on the right radial arterial access and manual pressure was held at the venous access site.

## 2012-09-08 NOTE — Interval H&P Note (Signed)
History and Physical Interval Note:  09/08/2012 11:42 AM  Bruce [name]  has presented today for surgery, with the diagnosis of Chest pain  The various methods of treatment have been discussed with the patient and family. After consideration of risks, benefits and other options for treatment, the patient has consented to  Procedure(s): LEFT AND RIGHT HEART CATHETERIZATION WITH CORONARY ANGIOGRAM (N/A) as a surgical intervention .  The patient's history has been reviewed, patient examined, no change in status, stable for surgery.  I have reviewed the patient's chart and labs.  Questions were answered to the patient's satisfaction.     Pamella Pert

## 2012-09-10 ENCOUNTER — Encounter: Payer: Self-pay | Admitting: Thoracic Surgery (Cardiothoracic Vascular Surgery)

## 2012-09-10 ENCOUNTER — Other Ambulatory Visit: Payer: Self-pay | Admitting: *Deleted

## 2012-09-10 ENCOUNTER — Institutional Professional Consult (permissible substitution) (INDEPENDENT_AMBULATORY_CARE_PROVIDER_SITE_OTHER): Payer: BC Managed Care – PPO | Admitting: Thoracic Surgery (Cardiothoracic Vascular Surgery)

## 2012-09-10 VITALS — BP 113/80 | HR 88 | Resp 20 | Ht 72.0 in | Wt 215.0 lb

## 2012-09-10 DIAGNOSIS — I351 Nonrheumatic aortic (valve) insufficiency: Secondary | ICD-10-CM

## 2012-09-10 DIAGNOSIS — I359 Nonrheumatic aortic valve disorder, unspecified: Secondary | ICD-10-CM

## 2012-09-10 DIAGNOSIS — I350 Nonrheumatic aortic (valve) stenosis: Secondary | ICD-10-CM

## 2012-09-10 NOTE — Progress Notes (Signed)
[address]       [name] [address]             [phone]     CARDIOTHORACIC SURGERY CONSULTATION REPORT  Referring Provider is Pamella Pert, MD PCP is Hoyle Sauer, MD  Chief Complaint  Patient presents with  . Aortic Stenosis    surgical eval, TEE 09/01/12, Cardiac Cath 09/08/12     HPI:  Patient is a 42 year old single white male referred by Dr. Jacinto Halim for possible surgical treatment of bicuspid aortic valve with aortic stenosis and aortic regurgitation.  The patient states that he was first noted to have a heart murmur on physical exam while he was in the [employer] in 1989. 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  He currently works as an Airline pilot and lives in [place], [place], although he also has a second home locally in [place]. He has been followed for several years by Dr. Felipa Eth who has been checking routine follow up echocardiograms every 2-3 years.  A followup transthoracic echocardiogram was performed last month that suggested the patient's aortic stenosis had progressed to the point of becoming severe with peak velocity across the valve approaching 4 m/s corresponding to peak and mean transvalvular gradients estimated to be 61 and 40 mm mercury respectively.  Left ventricular ejection fraction was estimated 40%. The patient was referred to Dr. Jacinto Halim who performed both TEE and left and right heart catheterization.  TEE confirmed the presence of what appeared to be a bicuspid aortic valve with at least moderate aortic stenosis and moderate aortic regurgitation. There was at least moderate global left ventricular systolic dysfunction with ejection fraction estimated 25-30%. Left and right heart catheterization was notable for the absence of significant coronary artery disease. There was moderate aortic stenosis with mean gradient across the aortic valve by cath measured 25.5 mm  mercury.  There was moderate aortic insufficiency and the thoracic aorta appeared relatively normal sized. Pulmonary artery pressures were normal. The patient was referred for elective surgical consultation.  The patient reports only occasional mild exertional shortness of breath. He remains reasonably active physically and he states that he has not really noticed any significant change in his breathing. He does complain of somewhat decreased energy. He has occasional twinges of tightness that radiated across his chest, particularly on the right side. These, and goes sporadically, last only a few seconds at most, and are not related to physical activity. The patient has occasional palpitations. He sometimes feels mildly uncomfortable breathing and when he lays flat in bed, but he also states that he feels anxious. He has occasional dizzy spells without syncope.  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 He has been seen by several neurologists over the years with concerns for possible TIA or possible variant form of migraine headache.  These episodes have been going on for nearly 6 years and always involve only the right eye, and the patient states that after they occur he does not feel well for several hours or occasionally a day or 2.  Past Medical History  Diagnosis Date  . Heart murmur     bicuspid valve  . Anxiety   . Seasonal allergies   . Aortic stenosis   . Aortic regurgitation   . Cardiomyopathy     Past Surgical History  Procedure Laterality Date  . Lithotripsy    . Tee without cardioversion N/A 09/01/2012    Procedure: TRANSESOPHAGEAL ECHOCARDIOGRAM (TEE);  Surgeon: Pamella Pert, MD;  Location: [facility] ENDOSCOPY;  Service: Cardiovascular;  Laterality: N/A;  h&p in file-[facility]    Family History  Problem Relation Age of Onset  . Cancer Mother     brain, deceased age 60      History   Social History  . Marital Status: Single    Spouse Name: N/A    Number of Children: N/A  . Years of Education: N/A   Occupational History  . Not on file.   Social History Main Topics  . Smoking status: Current Some Day Smoker -- 0.25 packs/day    Types: Cigarettes  . Smokeless tobacco: Not on file  . Alcohol Use: 0.0 oz/week    1-2 Shots of liquor per week  . Drug Use: No  . Sexually Active: Not on file   Other Topics Concern  . Not on file   Social History Narrative  . No narrative on file    Current Outpatient Prescriptions  Medication Sig Dispense Refill  . ALPRAZolam (XANAX) 0.5 MG tablet Take 0.5 mg by mouth at bedtime as needed. anxiety      . aspirin 81 MG chewable tablet Chew 81 mg by mouth daily.      . Cholecalciferol (VITAMIN D3) 2000 UNITS TABS Take 1 tablet by mouth 3 (three) times daily.      . fluticasone (FLONASE) 50 MCG/ACT nasal spray Place 1 spray into the nose daily.       Marland Kitchen ibuprofen (ADVIL,MOTRIN) 200 MG tablet Take 400 mg by mouth every 6 (six) hours as needed for pain. pain      . Multiple Vitamin (MULTIVITAMIN) tablet Take 1 tablet by mouth 2 (two) times daily.      Marland Kitchen omega-3 fish oil (MAXEPA) 1000 MG CAPS capsule Take 1 capsule by mouth 2 (two) times daily.      . vitamin C (ASCORBIC ACID) 500 MG tablet Take 500 mg by mouth daily.       No current facility-administered medications for this visit.    Allergies  Allergen Reactions  . Prednisone Swelling and Other (See Comments)    Makes him crazy  . Serzone (Nefazodone Hcl) Swelling and Other (See Comments)    Makes him crazy      Review of Systems:   General:  normal appetite, decreased energy, no weight gain, no weight loss, no fever  Cardiac:  No chest pain with exertion, occasional transient chest pain at rest, + SOB with exertion, has resting SOB when reclined, no PND, + orthopnea, no palpitations, no arrhythmia, no atrial fibrillation, no LE edema, many mild dizzy  spells one however was more severe in 2008 and caused the patient to fall down a flight of stairs, no syncope  Respiratory:  Has moderate shortness of breath, no home oxygen, no productive cough, no dry cough, no bronchitis, no wheezing, no hemoptysis, no asthma, no pain with inspiration or cough, no sleep apnea, no CPAP at night  GI:   no difficulty swallowing, no reflux, no frequent heartburn, no hiatal hernia, no abdominal pain, no constipation, no diarrhea, no hematochezia, no hematemesis, no melena  GU:   no dysuria,  no frequency, no urinary tract infection, no hematuria, no enlarged prostate, has had 3 kidney stones, no kidney disease  Vascular:  no pain suggestive of claudication, no pain in feet, no leg cramps,  no varicose veins, no DVT, no non-healing foot ulcer  Neuro:   no stroke, + possible TIA's, + possible seizures, no headaches, has had temporary blindness one right eye has occurred approximately 50 times after the last year alone,  no slurred speech, no peripheral neuropathy, no chronic pain, no instability of gait, no memory/cognitive dysfunction  Musculoskeletal: Very mild arthritis, no joint swelling, no myalgias, no difficulty walking, normal mobility   Skin:   Has a rash located on the interior portion of the patients right arm beneath the armpit, no itching, no skin infections, no pressure sores or ulcerations  Psych:   + anxiety, no depression, + nervousness, no unusual recent stress  Eyes:   no blurry vision, no floaters, + recent vision changes, does wear glasses or contacts  ENT:   no hearing loss, no loose or painful teeth, no dentures, last saw dentist after recent routine check  Hematologic:  no easy bruising, no abnormal bleeding, no clotting disorder, no frequent epistaxis  Endocrine:  no diabetes, does not check CBG's at home     Physical Exam:   BP 113/80  Pulse 88  Resp 20  Ht 6' (1.829 m)  Wt 215 lb (97.523 kg)  BMI 29.15 kg/m2  SpO2 98%  General:  Mildly  obese,  well-appearing  HEENT:  Unremarkable   Neck:   no JVD, no bruits, no adenopathy   Chest:   clear to auscultation, symmetrical breath sounds, no wheezes, no rhonchi   CV:   RRR, grade III/VI systolic murmur   Abdomen:  soft, non-tender, no masses   Extremities:  warm, well-perfused, pulses palpable, no LE edema  Rectal/GU  Deferred  Neuro:   Grossly non-focal and symmetrical throughout  Skin:   Clean and dry, no rashes, no breakdown   Diagnostic Tests:  Transthoracic Echocardiography  Patient: Bruce [name], Bruce [name] MR #: [id number] Study Date: 08/12/2012 Gender: M Age: 60 Height: 185.4cm Weight: 96.6kg BSA: 2.66m^2 Pt. Status: Room:  ATTENDING Lucianne Lei, Ravisankar R REFERRING Avva, Ravisankar R PERFORMING [facility] Gainer, [facility] SONOGRAPHER Junious Dresser, RDCS cc: Please fax to Dr. Despina Arias in [facility]  ------------------------------------------------------------ LV EF: 40%  ------------------------------------------------------------ Indications: 424.1 Aortic valve disorders. Biscuspid aortic valve 746.4.  ------------------------------------------------------------ History: PMH: Acquired from the patient and from the patient's chart. 2/6 Systolic murmur, visual loss in the right eye, and dizziness with exertion. Bicuspid aortic valve. Mild aortic stenosis.  ------------------------------------------------------------ Study Conclusions  - Left ventricle: The cavity size was moderately dilated. Wall thickness was increased in a pattern of mild LVH. The estimated ejection fraction was 40%. Doppler parameters are consistent with abnormal left ventricular relaxation (grade 1 diastolic dysfunction). - Aortic valve: Valve is possibly bicuspid but cannot tell for sure Cusp separation was reduced. Valve mobility was restricted. There was severe stenosis. Trivial regurgitation. Mean gradient: 40mm Hg (S). Peak gradient: 61mm Hg (S). - Left  atrium: The atrium was mildly dilated.  ------------------------------------------------------------ Labs, prior tests, procedures, and surgery: Echocardiography (2010). The study demonstrated LV dilation and LV hypertrophy. The aortic valve showed mild stenosis and mild regurgitation.  Transthoracic echocardiography. M-mode, complete 2D, spectral Doppler, and color Doppler. Height: Height: 185.4cm. Height: 73in. Weight: Weight: 96.6kg. Weight: 212.6lb. Body mass index: BMI: 28.1kg/m^2. Body surface area: BSA: 2.36m^2. Blood pressure: 120/86. Patient status: Outpatient. Location: [facility]  ------------------------------------------------------------  ------------------------------------------------------------ Left ventricle: Mid and basal inferior wall hypokinesis The cavity size was moderately dilated. Wall thickness was increased in a pattern of  mild LVH. The estimated ejection fraction was 40%. Doppler parameters are consistent with abnormal left ventricular relaxation (grade 1 diastolic dysfunction).  ------------------------------------------------------------ Aortic valve: Valve is possibly bicuspid but cannot tell for sure Well visualized. Cusp separation was reduced. Valve mobility was restricted. Doppler: There was severe stenosis. Trivial regurgitation. VTI ratio of LVOT to aortic valve: 0.23. Valve area: 1.64cm^2(VTI). Indexed valve area: 0.74cm^2/m^2 (VTI). Peak velocity ratio of LVOT to aortic valve: 0.24. Valve area: 1.68cm^2 (Vmax). Indexed valve area: 0.76cm^2/m^2 (Vmax). Mean gradient: 40mm Hg (S). Peak gradient: 61mm Hg (S).  ------------------------------------------------------------ Aorta: The aorta was normal, not dilated, and non-diseased.  ------------------------------------------------------------ Mitral valve: Structurally normal valve. Leaflet separation was normal. Doppler: Transvalvular velocity was within the normal range. There was  no evidence for stenosis. Trivial regurgitation.  ------------------------------------------------------------ Left atrium: The atrium was mildly dilated.  ------------------------------------------------------------ Right ventricle: The cavity size was normal. Wall thickness was normal. Systolic function was normal.  ------------------------------------------------------------ Pulmonic valve: Structurally normal valve. Cusp separation was normal. Doppler: Transvalvular velocity was within the normal range. Trivial regurgitation.  ------------------------------------------------------------ Tricuspid valve: Structurally normal valve. Leaflet separation was normal. Doppler: Transvalvular velocity was within the normal range. No regurgitation.  ------------------------------------------------------------ Pulmonary artery: The main pulmonary artery was normal-sized. Systolic pressure was within the normal range.  ------------------------------------------------------------ Right atrium: The atrium was normal in size.  ------------------------------------------------------------ Pericardium: The pericardium was normal in appearance.  ------------------------------------------------------------ Systemic veins: Inferior vena cava: The vessel was normal in size; the respirophasic diameter changes were in the normal range (= 50%); findings are consistent with normal central venous pressure.  ------------------------------------------------------------ Post procedure conclusions Ascending Aorta:  - The aorta was normal, not dilated, and non-diseased.  ------------------------------------------------------------  2D measurements Normal Doppler measurements Normal Left ventricle Left ventricle LVID ED, 59.3 mm 43-52 Ea, lat 12.6 cm/s ------ chord, ann, tiss PLAX DP LVID ES, 51.1 mm 23-38 E/Ea, lat 4.03 ------ chord, ann, tiss PLAX DP FS, chord, 14 % >29 Ea, med 6.47 cm/s  ------ PLAX ann, tiss LVPW, ED 13.2 mm ------ DP IVS/LVPW 0.89 <1.3 E/Ea, med 7.85 ------ ratio, ED ann, tiss Vol ED, 208 ml ------ DP MOD1 LVOT Vol ES, 144 ml ------ Peak vel, 92.5 cm/s ------ MOD1 S EF, MOD1 31 % ------ VTI, S 20.6 cm ------ Vol index, 94 ml/m^2 ------ Stroke vol 145. ml ------ ED, MOD1 6 Vol index, 65 ml/m^2 ------ Stroke 65.9 ml/m^2 ------ ES, MOD1 index Vol ED, 209 ml ------ Aortic valve MOD2 Peak vel, 390 cm/s ------ Vol ES, 141 ml ------ S MOD2 Mean vel, 294 cm/s ------ EF, MOD2 33 % ------ S Stroke 68 ml ------ VTI, S 88.6 cm ------ vol, MOD2 Mean 40 mm Hg ------ Vol index, 95 ml/m^2 ------ gradient, ED, MOD2 S Vol index, 64 ml/m^2 ------ Peak 61 mm Hg ------ ES, MOD2 gradient, Stroke 30.8 ml/m^2 ------ S index, VTI ratio 0.23 ------ MOD2 LVOT/AV Ventricular septum Area, VTI 1.64 cm^2 ------ IVS, ED 11.8 mm ------ Area index 0.74 cm^2/m ------ LVOT (VTI) ^2 Diam, S 30 mm ------ Peak vel 0.24 ------ Area 7.07 cm^2 ------ ratio, Diam 30 mm ------ LVOT/AV Aorta Area, Vmax 1.68 cm^2 ------ Root diam, 32 mm ------ Area index 0.76 cm^2/m ------ ED (Vmax) ^2 AAo AP 36 mm ------ Mitral valve diam, S Peak E vel 50.8 cm/s ------ Left atrium Peak A vel 61.7 cm/s ------ AP dim 40 mm ------ Decelerati 296 ms 150-23 AP dim 1.81 cm/m^2 <2.2 on time 0 index Peak E/A 0.8 ------ ratio  Right ventricle Sa vel, 11.6 cm/s ------ lat ann, tiss DP  ------------------------------------------------------------ Prepared and Electronically Authenticated by  Charlton Haws 2014-06-25T15:10:[id number]    Transesophageal Echocardiography  Patient: Bruce [name], Bruce [name] MR #: [id number] Study Date: 09/01/2012 Gender: M Age: 12 Height: 182.9cm Weight: 95.5kg BSA: 2.91m^2 Pt. Status: Room: [facility]  PERFORMING Yates Decamp SONOGRAPHER Jeryl Columbia Valrie Hart, Tyrone Nine cc:  ------------------------------------------------------------ LV EF: 25% -  30%  ------------------------------------------------------------ Indications: Aortic stenosis 424.1.  ------------------------------------------------------------ Study Conclusions  - Left ventricle: The cavity size was mildly dilated. There was mild concentric hypertrophy. Systolic function was severely reduced. The estimated ejection fraction was in the range of 25% to 30%. Diffuse hypokinesis. - Aortic valve: Mildly calcified annulus. Possibly bicuspid; moderately calcified leaflets. Calcification. Cusp separation was severely reduced. There was severe stenosis. Valve area: 0.69cm^2(VTI). Valve area: 0.66cm^2 (Vmax). Transesophageal echocardiography. 2D and color Doppler. Height: Height: 182.9cm. Height: 72in. Weight: Weight: 95.5kg. Weight: 210lb. Body mass index: BMI: 28.5kg/m^2. Body surface area: BSA: 2.34m^2. Blood pressure: 112/83. Patient status: Inpatient. Location: Endoscopy.  ------------------------------------------------------------  ------------------------------------------------------------ Left ventricle: The cavity size was mildly dilated. There was mild concentric hypertrophy. Systolic function was severely reduced. The estimated ejection fraction was in the range of 25% to 30%. Diffuse hypokinesis.  ------------------------------------------------------------ Aortic valve: Not well visualized. Mildly calcified annulus. Possibly bicuspid; moderately calcified leaflets. Calcification. Cusp separation was severely reduced. Doppler: There was severe stenosis. VTI ratio of LVOT to aortic valve: 0.17. Valve area: 0.69cm^2(VTI). Indexed valve area: 0.31cm^2/m^2 (VTI). Peak velocity ratio of LVOT to aortic valve: 0.16. Valve area: 0.66cm^2 (Vmax). Indexed valve area: 0.3cm^2/m^2 (Vmax). Mean gradient: 32mm Hg (S). Peak gradient: 52mm Hg (S).  ------------------------------------------------------------ Aorta: There was no atheroma. There was no evidence  for dissection. Aortic root: The aortic root was not dilated. Ascending aorta: The ascending aorta was normal in size. Aortic arch: The aortic arch was normal in size. Descending aorta: The descending aorta was normal in size.  ------------------------------------------------------------ Mitral valve: Structurally normal valve. Leaflet separation was normal. Doppler: No significant regurgitation.  ------------------------------------------------------------ Left atrium: The atrium was normal in size. No evidence of thrombus in the atrial cavity or appendage. The appendage was morphologically a left appendage, multilobulated, and of normal size. Emptying velocity was normal.  ------------------------------------------------------------ Right ventricle: The cavity size was normal. Wall thickness was normal. Systolic function was normal.  ------------------------------------------------------------ Pulmonic valve: Structurally normal valve.  ------------------------------------------------------------ Tricuspid valve: Structurally normal valve. Leaflet separation was normal. Doppler: No significant regurgitation.  ------------------------------------------------------------ Pulmonary artery: The main pulmonary artery was normal-sized.  ------------------------------------------------------------ Right atrium: The atrium was normal in size. No evidence of thrombus in the atrial cavity or appendage. The appendage was morphologically a right appendage.  ------------------------------------------------------------ Pericardium: There was no pericardial effusion.  ------------------------------------------------------------  2D measurements Normal Doppler measurements Norma LVOT l Diam, S 23 mm ------ LVOT Area 4.15 cm^2 ------ Peak vel, 57. cm/s ----- S 8 VTI, S 12. cm ----- 9 Aortic valve Peak vel, 361 cm/s ----- S Mean vel, 261 cm/s ----- S VTI, S 77. cm  ----- 2 Mean 32 mm Hg ----- gradient, S Peak 52 mm Hg ----- gradient, S VTI ratio 0.1 ----- LVOT/AV 7 Area, VTI 0.6 cm^2 ----- 9 Area 0.3 cm^2/m^2 ----- index 1 (VTI) Peak vel 0.1 ----- ratio, 6 LVOT/AV Area, 0.6 cm^2 ----- Vmax 6 Area 0.3 cm^2/m^2 ----- index (Vmax)  ------------------------------------------------------------ Prepared and Electronically Authenticated by  Yates Decamp 2014-07-15T18:44:[id number]    CARDIAC CATHETERIZATION  Procedures performed: Right and left heart catheterization and occlusion  of cardiac output and cardiac index by Fick. Right radial arterial access for left heart catheterization and right antecubital vein access for right heart catheterization was utilized for performing the procedure.  Indication: Patient is a 42 year-old , male with recently diagnosed cardiomyopathy with severe LV systolic dysfunction, severe aortic stenosis, bicuspid aortic valve. Due to symptomatic severe aortic stenosis, patient underwent TEE, which confirmed severe bicuspid aortic valve stenosis and mild to moderate aortic regurgitation. Hence was scheduled for left and right heart catheterization for preoperative cardiac evaluation. Ascending aortogram and descending thoracic aortogram was performed to evaluate for presence of aneurysm, coarctation of the aorta, to evaluate surgical feasibility for lateral thoracotomy approach for aortic valve replacement.  Procedural data:  RA pressure 10/9 Mean 7 mm mercury. RA saturation 67%.  RV pressure 25/5 and Right ventricular EDP 9 mm Hg.  PA pressure 21/12 with a mean of 16 mm mercury. PA saturation 70%.  Pulmonary capillary wedge 15/15 with a mean of 13 mm Hg. Aortic saturation 97%.  Cardiac output was 4.43 with cardiac index of 2.01 by Fick.  Aortic valve area cavitated at 1.12 cm2. Peak to peak gradient was 44 mmHg, mean gradient was 25.5 mmHg.  Left heart catheterization hemodynamic data: Left ventricle pressure 156/9 with  end-diastolic pressure of 15 mmHg.aortic pressure was 131/75 with a mean of 99 mmHg. Aortic valve area cavitated at 1.12 cm2. Peak to peak gradient was 44 mmHg, mean gradient was 25.5 mmHg.  Angiographic data  Left ventricle: performed. Left systolic shows normal ejection fraction of 35-40% The left ventricle is dilated moderately.  Right coronary artery: Smooth, dominant and normal. Large proximal RV branch noted.  Left main coronary artery: Normal. No stenosis.  LAD: Large, smooth and normal. Gives origin to a moderate sized diagonal 1 which is smooth and normal.  Circumflex coronary artery: Smooth normal. Non-dominant.  Ascending aortogram/ascending thoracic aortogram: Moderate aortic valve calcification evident. Moderate aortic regurgitation. Descending thoracic and abdominal aorta reveals no evidence of abdominal aortic aneurysm or aortic coarctation.  Impression: Normal left and right heart catheterizaton with normal coronary arteries.  Moderate aortic regurgitation, moderate aortic stenosis with moderate to severe LV systolic dysfunction, LV dilatation. Congenital bicuspid aortic valve without any evidence of ascending or descending thoracic or abdominal aortic aneurysm.  Recommendation: As patient has left ventricular systolic dysfunction, patient needs identified replacement. Patient will be referred for cardio-thoracic surgical consultation.  Technique:  A 5 French brachial sheath introduced into right AC vein access. A 5 French Swan-Ganz catheter was advanced with balloon inflated on the sheath under fluoroscopic guidance into first the right atrium followed by the right ventricle and into the pulmonary artery to pulmonary artery wedge position. Hemodynamics were obtained in a locations. After hemodynamics were completed, samples were taken for SaO2% measurement to be used in [facility] /Index catheterization. The catheter was then pulled back the balloon down and then completely out of the body.   Left Heart Catheterization  First a 5 [place] TIG 4 catheter was advanced over standard J-wire into the ascending aorta and used to engage first the Left and Right Coronary Artery. Multiple cineangiographic views of the Left then Right Coronary Artery system(s) were performed.  A 5 French angled pigtail catheter which was used to cross the aortic valve for measurement Left Ventricular Hemodynamics. I utilized a 0.035 inch versacore wire for crossing the aortic valve. Left ventriculography was then performed in the RAO projection. Hemodynamics were then resampled and the catheter pulled back across the aortic  valve for measurement of "pullback" gradient. The catheter was then removed the body over wire. All exchanges were made over standard J wire. Hemostasis was obtained by applying TR band on the right radial arterial access and manual pressure was held at the venous access site.        Impression:  The patient has a bicuspid aortic valve with moderate to severe aortic stenosis with at least moderate aortic regurgitation that is now associated with significant left ventricular systolic dysfunction. The patient has relatively mild symptomatology with only mild exertional shortness of breath and some reported generalized fatigue and atypical chest pains.  He also reports some dizzy spells.  Options include proceeding with elective surgical intervention versus continued close observation and followup.  Because of the recent fall in left ventricular systolic function I would be inclined to recommend proceeding with surgery sooner rather than later.  Surgical options include conventional surgical aortic valve replacement using either a mechanical prosthesis or a bioprosthetic tissue valve through either conventional sternotomy or minimally invasive techniques.  In addition, the patient might be a reasonable candidate for a Ross procedure.  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 I am skeptical that these episodes are embolic TIAs from the patient's aortic valve, and transesophageal echocardiogram was notable for the absence of any other potential source.   Plan:  The patient was counseled at length regarding alternatives with respect to valve replacement including continued medical therapy versus proceeding with conventional surgical aortic valve replacement using either a mechanical prosthesis or a bioprosthetic tissue valve.  Other alternatives including the Ross autograft procedure, homograft aortic root replacement, stentless bioprosthetic tissue valve replacement, valve repair, and transcatheter aortic valve replacement were discussed.  Discussion was held comparing the relative risks of mechanical valve replacement with need for lifelong anticoagulation versus use of a bioprosthetic tissue valve and the associated potential for late structural valve deterioration in failure.  Alternative surgical approaches have been discussed, including a comparison between conventional sternotomy and minimally-invasive techniques.  The relative risks and benefits of each have been reviewed as they pertain to the patient's specific circumstances, and all of their questions have been addressed.  The patient desires to think matters over and consult further with Dr. Jacinto Halim before making a final decision.  He will call to schedule followup appointment as desired.      Bruce Decent. Cornelius Moras, MD 09/10/2012 3:40 PM

## 2012-09-18 ENCOUNTER — Ambulatory Visit
Admission: RE | Admit: 2012-09-18 | Discharge: 2012-09-18 | Disposition: A | Discharge status: Home / Self Care | Payer: BC Managed Care – PPO | Source: Ambulatory Visit | Attending: Thoracic Surgery (Cardiothoracic Vascular Surgery) | Admitting: Thoracic Surgery (Cardiothoracic Vascular Surgery)

## 2012-09-18 DIAGNOSIS — I359 Nonrheumatic aortic valve disorder, unspecified: Secondary | ICD-10-CM

## 2012-09-18 MED ORDER — IOHEXOL 350 MG/ML SOLN
80.0000 mL | Freq: Once | INTRAVENOUS | Status: AC | PRN
  Administered 2012-09-18: 80 mL via INTRAVENOUS

## 2012-09-30 ENCOUNTER — Other Ambulatory Visit: Payer: Self-pay | Admitting: *Deleted

## 2012-09-30 ENCOUNTER — Encounter: Payer: Self-pay | Admitting: Thoracic Surgery (Cardiothoracic Vascular Surgery)

## 2012-09-30 ENCOUNTER — Ambulatory Visit (INDEPENDENT_AMBULATORY_CARE_PROVIDER_SITE_OTHER): Payer: BC Managed Care – PPO | Admitting: Thoracic Surgery (Cardiothoracic Vascular Surgery)

## 2012-09-30 VITALS — BP 122/75 | HR 90 | Resp 18 | Ht 72.0 in | Wt 215.0 lb

## 2012-09-30 DIAGNOSIS — I359 Nonrheumatic aortic valve disorder, unspecified: Secondary | ICD-10-CM | POA: Insufficient documentation

## 2012-09-30 DIAGNOSIS — I351 Nonrheumatic aortic (valve) insufficiency: Secondary | ICD-10-CM | POA: Insufficient documentation

## 2012-09-30 DIAGNOSIS — I350 Nonrheumatic aortic (valve) stenosis: Secondary | ICD-10-CM | POA: Insufficient documentation

## 2012-09-30 NOTE — Patient Instructions (Signed)
The patient should make every effort to stop smoking immediately and permanently.  

## 2012-09-30 NOTE — Progress Notes (Signed)
[address]       [name] [address]             [phone]     CARDIOTHORACIC SURGERY OFFICE NOTE  Referring Provider is Pamella Pert, MD PCP is Hoyle Sauer, MD   HPI:  Patient returns for followup of aortic stenosis and aortic regurgitation. He was originally seen in consultation on 09/10/2012.  Since then he has been seen in followup by Dr. Jacinto Halim and he has now made a decision that he wants to proceed with elective aortic valve replacement. Return to the office today with his friend to discuss treatment options further. He reports no new problems or complaints over the last 2 weeks.   Current Outpatient Prescriptions  Medication Sig Dispense Refill  . ALPRAZolam (XANAX) 0.5 MG tablet Take 0.5 mg by mouth at bedtime as needed. anxiety      . aspirin 81 MG chewable tablet Chew 81 mg by mouth daily.      Marland Kitchen atorvastatin (LIPITOR) 80 MG tablet Take 80 mg by mouth daily.       . fluticasone (FLONASE) 50 MCG/ACT nasal spray Place 1 spray into the nose daily.       . Cholecalciferol (VITAMIN D3) 2000 UNITS TABS Take 1 tablet by mouth 3 (three) times daily.      Marland Kitchen ibuprofen (ADVIL,MOTRIN) 200 MG tablet Take 400 mg by mouth every 6 (six) hours as needed for pain. pain      . Multiple Vitamin (MULTIVITAMIN) tablet Take 1 tablet by mouth 2 (two) times daily.      Marland Kitchen omega-3 fish oil (MAXEPA) 1000 MG CAPS capsule Take 1 capsule by mouth 2 (two) times daily.      . vitamin C (ASCORBIC ACID) 500 MG tablet Take 500 mg by mouth daily.       No current facility-administered medications for this visit.      Physical Exam:   BP 122/75  Pulse 90  Resp 18  Ht 6' (1.829 m)  Wt 215 lb (97.523 kg)  BMI 29.15 kg/m2  SpO2 98%  General:  Somewhat anxious but well-appearing  Chest:   Clear to auscultation  CV:   Regular rate and rhythm with systolic murmur  Incisions:  n/a  Abdomen:  Soft and nontender  Extremities:  Warm and well-perfused    Diagnostic  Tests:  CT ANGIOGRAPHY CHEST   Technique: Multidetector CT imaging of the chest using the  standard protocol during bolus administration of intravenous  contrast. Multiplanar reconstructed images including MIPs were  obtained and reviewed to evaluate the vascular anatomy.  Contrast: 80mL OMNIPAQUE IOHEXOL 350 MG/ML SOLN  Comparison: Prior CT scan of the abdomen pelvis 04/25/2010  Findings:  Mediastinum: Unremarkable CT appearance of the thyroid gland. No  suspicious mediastinal or hilar adenopathy. No soft tissue  mediastinal mass. The thoracic esophagus is unremarkable.  Heart/Vascular: Conventional three-vessel arch anatomy. No  evidence of significant aortic root dilatation or aneurysmal  dilatation of the ascending aorta. The aortic root measures 3.7 cm  at the sinuses of Valsalva and at 2.9 cm at the sinotubular  junction. Maximal diameter of the tubular portion of the ascending  aorta is 3.2 cm. The transverse aorta measures up to 2.8 cm. The  descending thoracic aorta measures 2.2 cm. 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 There is  dense calcification and thickening of the aortic valve consistent  with a bicuspid morphology. Although limited by non cardiac gated  technique. There is a suggestion of calcification along the course  of the left anterior descending coronary artery. Mild diffuse  thickening of the left ventricular myocardium. The heart itself is  within normal limits for size. No pericardial effusion.  Lungs/Pleura: Small 4 mm subpleural nodule associated with the  right major fissure is almost certainly a subpleural lymph node.  The lungs are otherwise clear.  Upper Abdomen: The visualized upper abdomen is unremarkable.  Bones: No acute fracture or aggressive appearing lytic or blastic    osseous lesion.  IMPRESSION:  1. Calcification and thickening of the aortic valve consistent  with bicuspid morphology.  2. No significant dilatation of the aortic root or ascending  aorta.  3. 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  4. Suggestion of calcification along the course of the left  anterior descending coronary artery. Evaluation is limited by non  gated cardiac technique, however coronary artery disease is  suspected. If clinically warranted, dedicated cardiac CTA could  further evaluate.  Original Report Authenticated By: Malachy Moan, M.D.    Impression:  The patient has a bicuspid aortic valve with moderate to severe aortic stenosis with at least moderate aortic regurgitation that is now associated with significant left ventricular systolic dysfunction. The patient has relatively mild symptomatology with only mild exertional shortness of breath and some reported generalized fatigue and atypical chest pains. He also reports some dizzy spells. Options include proceeding with elective surgical intervention versus continued close observation and followup. Because of the recent fall in left ventricular systolic function I would be inclined to recommend proceeding with surgery sooner rather than later. CT angiogram of the chest is notable for the absence of significant aneurysmal enlargement of the ascending thoracic aorta.  Surgical options include conventional surgical aortic valve replacement using either a mechanical prosthesis or a bioprosthetic tissue valve through either conventional sternotomy or minimally invasive techniques. In addition, the patient might be a reasonable candidate for a Ross procedure.   Plan:  The patient and his friend were counseled at length regarding alternatives with respect to valve replacement including continued medical therapy versus  proceeding with conventional surgical aortic valve replacement using either a mechanical prosthesis or a bioprosthetic tissue valve. Other alternatives including the Ross autograft procedure, homograft aortic root replacement, stentless bioprosthetic tissue valve replacement, valve repair, and transcatheter aortic valve replacement were discussed. Discussion was held comparing the relative risks of mechanical valve replacement with need for lifelong anticoagulation versus use of a bioprosthetic tissue valve and the associated potential for late structural valve deterioration in failure. Alternative surgical approaches have been discussed, including a comparison between conventional sternotomy and minimally-invasive techniques. The relative risks and benefits of each have been reviewed as they pertain to the patient's specific circumstances, and all of their questions have been addressed.  The patient has decided that he wants to undergo elective aortic valve replacement using a bioprosthetic tissue valve with minimally invasive surgical techniques. We spent in excess of 60 minutes discussing options and the risks and benefits associated with both use of the bioprosthetic tissue valve and the minimally invasive approach for surgery.  Expectations for his postoperative recovery has been discussed  at length.  All of their questions have been addressed.  We tentatively plan to proceed with surgery on Wednesday, 10/28/2012. The patient will return for followup prior to surgery on Monday, 10/26/2012.    Salvatore Decent. Cornelius Moras, MD 09/30/2012 4:12 PM

## 2012-10-21 ENCOUNTER — Encounter (HOSPITAL_COMMUNITY): Payer: Self-pay | Admitting: Pharmacy Technician

## 2012-10-26 ENCOUNTER — Encounter: Payer: Self-pay | Admitting: Thoracic Surgery (Cardiothoracic Vascular Surgery)

## 2012-10-26 ENCOUNTER — Encounter (HOSPITAL_COMMUNITY)
Admission: RE | Admit: 2012-10-26 | Discharge: 2012-10-26 | Disposition: A | Discharge status: Home / Self Care | Payer: BC Managed Care – PPO | Source: Ambulatory Visit | Attending: Thoracic Surgery (Cardiothoracic Vascular Surgery) | Admitting: Thoracic Surgery (Cardiothoracic Vascular Surgery)

## 2012-10-26 ENCOUNTER — Ambulatory Visit (INDEPENDENT_AMBULATORY_CARE_PROVIDER_SITE_OTHER): Payer: BC Managed Care – PPO | Admitting: Thoracic Surgery (Cardiothoracic Vascular Surgery)

## 2012-10-26 ENCOUNTER — Encounter (HOSPITAL_COMMUNITY): Payer: Self-pay

## 2012-10-26 ENCOUNTER — Ambulatory Visit (HOSPITAL_COMMUNITY)
Admission: RE | Admit: 2012-10-26 | Discharge: 2012-10-26 | Disposition: A | Discharge status: Home / Self Care | Payer: BC Managed Care – PPO | Source: Ambulatory Visit | Attending: Thoracic Surgery (Cardiothoracic Vascular Surgery) | Admitting: Thoracic Surgery (Cardiothoracic Vascular Surgery)

## 2012-10-26 VITALS — BP 116/72 | HR 75 | Resp 20 | Ht 72.0 in | Wt 210.0 lb

## 2012-10-26 VITALS — BP 118/79 | HR 63 | Temp 97.9°F | Resp 20 | Ht 72.0 in | Wt 210.3 lb

## 2012-10-26 DIAGNOSIS — I359 Nonrheumatic aortic valve disorder, unspecified: Secondary | ICD-10-CM | POA: Insufficient documentation

## 2012-10-26 DIAGNOSIS — Z01812 Encounter for preprocedural laboratory examination: Secondary | ICD-10-CM | POA: Insufficient documentation

## 2012-10-26 DIAGNOSIS — I351 Nonrheumatic aortic (valve) insufficiency: Secondary | ICD-10-CM

## 2012-10-26 DIAGNOSIS — Z01811 Encounter for preprocedural respiratory examination: Secondary | ICD-10-CM | POA: Insufficient documentation

## 2012-10-26 DIAGNOSIS — I350 Nonrheumatic aortic (valve) stenosis: Secondary | ICD-10-CM

## 2012-10-26 DIAGNOSIS — Z01810 Encounter for preprocedural cardiovascular examination: Secondary | ICD-10-CM | POA: Insufficient documentation

## 2012-10-26 DIAGNOSIS — Z01818 Encounter for other preprocedural examination: Secondary | ICD-10-CM | POA: Insufficient documentation

## 2012-10-26 HISTORY — DX: Unspecified osteoarthritis, unspecified site: M19.90

## 2012-10-26 HISTORY — DX: Personal history of urinary calculi: Z87.442

## 2012-10-26 HISTORY — DX: Effusion, unspecified joint: M25.40

## 2012-10-26 HISTORY — DX: Shortness of breath: R06.02

## 2012-10-26 HISTORY — DX: Hyperlipidemia, unspecified: E78.5

## 2012-10-26 LAB — COMPREHENSIVE METABOLIC PANEL
AST: 19 U/L (ref 0–37)
BUN: 12 mg/dL (ref 6–23)
CO2: 20 mEq/L (ref 19–32)
Chloride: 105 mEq/L (ref 96–112)
Creatinine, Ser: 0.79 mg/dL (ref 0.50–1.35)
GFR calc non Af Amer: >90 mL/min (ref >90)
Glucose, Bld: 99 mg/dL (ref 70–99)
Total Bilirubin: 0.3 mg/dL (ref 0.3–1.2)

## 2012-10-26 LAB — URINALYSIS, ROUTINE W REFLEX MICROSCOPIC
Hgb urine dipstick: NEGATIVE
Nitrite: NEGATIVE
Specific Gravity, Urine: 1.024 (ref 1.005–1.030)
Urobilinogen, UA: 0.2 mg/dL (ref 0.0–1.0)

## 2012-10-26 LAB — PULMONARY FUNCTION TEST

## 2012-10-26 LAB — CBC
HCT: 44.4 % (ref 39.0–52.0)
Hemoglobin: 15.6 g/dL (ref 13.0–17.0)
MCV: 87.2 fL (ref 78.0–100.0)
Platelets: 254 10*3/uL (ref 150–400)
RBC: 5.09 MIL/uL (ref 4.22–5.81)
WBC: 8.9 10*3/uL (ref 4.0–10.5)

## 2012-10-26 LAB — TYPE AND SCREEN: Antibody Screen: NEGATIVE

## 2012-10-26 LAB — BLOOD GAS, ARTERIAL
Acid-base deficit: 2 mmol/L (ref 0.0–2.0)
Bicarbonate: 21.8 mEq/L (ref 20.0–24.0)
O2 Saturation: 96.8 %
TCO2: 22.9 mmol/L (ref 0–100)
pO2, Arterial: 81.7 mmHg (ref 80.0–100.0)

## 2012-10-26 LAB — HEMOGLOBIN A1C: Mean Plasma Glucose: 126 mg/dL — ABNORMAL HIGH (ref <117)

## 2012-10-26 LAB — SURGICAL PCR SCREEN: MRSA, PCR: NEGATIVE

## 2012-10-26 LAB — ABO/RH: ABO/RH(D): A POS

## 2012-10-26 MED ORDER — CHLORHEXIDINE GLUCONATE 4 % EX LIQD: 30.0000 mL | CUTANEOUS | Status: DC

## 2012-10-26 NOTE — Progress Notes (Signed)
      [address]       [name] [address]             [phone]     CARDIOTHORACIC SURGERY OFFICE NOTE  Referring Provider is Pamella Pert, MD PCP is Hoyle Sauer, MD   HPI:  Patient returns for followup of aortic stenosis and aortic regurgitation with tentative plans to proceed with aortic valve replacement later this week. He was originally seen in consultation on 09/10/2012 and most recently he was seen in the office on 09/30/2012.  Since he was last seen in the office he reports no new problems or complaints. He specifically denies any fevers chills or productive cough. He did short of breath with activity including when he is eating.  He denies any resting shortness of breath. He has not had any tachypalpitations or dizzy spells. He reports no other problems or complaints other than the fact that he is a bit anxious regarding his up coming surgery.    Current Outpatient Prescriptions  Medication Sig Dispense Refill  . ALPRAZolam (XANAX) 0.5 MG tablet Take 0.5 mg by mouth at bedtime as needed. anxiety      . atorvastatin (LIPITOR) 80 MG tablet Take 80 mg by mouth daily.       . fluticasone (FLONASE) 50 MCG/ACT nasal spray Place 1 spray into the nose daily.       . Multiple Vitamins-Minerals (AIRBORNE PO) Take by mouth daily.       No current facility-administered medications for this visit.   Facility-Administered Medications Ordered in Other Visits  Medication Dose Route Frequency Provider Last Rate Last Dose  . chlorhexidine (HIBICLENS) 4 % liquid 2 application  30 mL Topical UD Purcell Nails, MD          Physical Exam:   BP 116/72  Pulse 75  Resp 20  Ht 6' (1.829 m)  Wt 210 lb (95.255 kg)  BMI 28.47 kg/m2  SpO2 99%  General:  Well-appearing  Chest:   Clear to auscultation with symmetrical breath sounds  CV:   Regular rate and rhythm with harsh systolic murmur  Incisions:  n/a  Abdomen:  Soft and nontender  Extremities:  Warm  and well-perfused  Diagnostic Tests:  n/a   Impression:  The patient has a bicuspid aortic valve with moderate to severe aortic stenosis with at least moderate aortic regurgitation that is now associated with significant left ventricular systolic dysfunction.    Plan:  I have again reviewed the indications, risks, and potential benefits of aortic valve replacement with the patient and his close friend here in the office today. The patient specifically requests that his valve be replaced using a bioprosthetic tissue valve via the mini thoracotomy approach.  The patient understands and accepts all potential associated risks of surgery including but not limited to risk of death, stroke, myocardial infarction, congestive heart failure, respiratory failure, renal failure, bleeding requiring blood transfusion and/or reexploration, arrhythmia, heart block or bradycardia requiring permanent pacemaker, pneumonia, pleural effusion, wound infection, pulmonary embolus or other thromboembolic complication, chronic pain or other delayed complications related to valve replacement including paravalvular leak, prosthetic valve endocarditis, and/or structural deterioration and failure requiring repeat surgical intervention.  All questions answered.    Salvatore Decent. Cornelius Moras, MD 10/26/2012 4:50 PM

## 2012-10-26 NOTE — Progress Notes (Signed)
Confirmed that Dopplers and PFT's were done today

## 2012-10-26 NOTE — Progress Notes (Signed)
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

## 2012-10-26 NOTE — Progress Notes (Signed)
VASCULAR LAB PRELIMINARY  PRELIMINARY  PRELIMINARY  PRELIMINARY  Pre-op Cardiac Surgery  Carotid Findings:  Bilateral - 1% to 39% ICA stenosis. Vertebral artery flow is antegrade.  Upper Extremity Right Left  Brachial Pressures 120 Triphasic 101 Triphasic  Radial Waveforms Triphasic Triphasic  Ulnar Waveforms Sharpe monophasic difficult to find Triphasic  Palmar Arch (Allen's Test) Abnormal Normal   Findings:   Doppler waveforms remained normal with radial compression and obliterated with ulnar compression on the right. Left Doppler waveforms remained normal with both radial and ulnar compressions.    Devaughn Savant, RVS 10/26/2012, 1:56 PM

## 2012-10-26 NOTE — Progress Notes (Signed)
Cardiologist is Dr.Ganji-to request last office visit  TEE done and in epic from 09-01-12  Heart cath in epic from 09-08-12  Echo report in epic from 08-12-12  Denies ever having a stress test  Medical Md is Dr Felipa Eth

## 2012-10-26 NOTE — Pre-Procedure Instructions (Signed)
OUSMANE SEEMAN  10/26/2012    Your procedure is scheduled on:  Wed, Sept 10 @ 8:30 AM  Report to [facility] at 6:30 AM.  Call this number if you have problems the morning of surgery: [phone]   Remember:   Do not eat food or drink liquids after midnight.   Take these medicines the morning of surgery with A SIP OF WATER: Xanax(Alprazolam) and Fluticasone(Flonase-if needed)               No Goody's,BC's,Aleve,Ibuprofen,Fish Oil,or any Herbal Medications   Do not wear jewelry  Do not wear lotions, powders, or colognes. You may wear deodorant.  Men may shave face and neck.  Do not bring valuables to the hospital.  [facility] is not responsible                   for any belongings or valuables.  Contacts, dentures or bridgework may not be worn into surgery.  Leave suitcase in the car. After surgery it may be brought to your room.  For patients admitted to the hospital, checkout time is 11:00 AM the day of  discharge.     Special Instructions: Shower using CHG 2 nights before surgery and the night before surgery.  If you shower the day of surgery use CHG.  Use special wash - you have one bottle of CHG for all showers.  You should use approximately 1/3 of the bottle for each shower.   Please read over the following fact sheets that you were given: Pain Booklet, Coughing and Deep Breathing, Blood Transfusion Information, Open Heart Packet, MRSA Information and Surgical Site Infection Prevention

## 2012-10-27 MED ORDER — DEXMEDETOMIDINE HCL IN NACL 400 MCG/100ML IV SOLN
0.1000 ug/kg/h | INTRAVENOUS | Status: AC
  Administered 2012-10-28: 0.2 ug/kg/h via INTRAVENOUS
  Filled 2012-10-27 (×2): qty 100

## 2012-10-27 MED ORDER — DEXTROSE 5 % IV SOLN
750.0000 mg | INTRAVENOUS | Status: DC
  Filled 2012-10-27 (×2): qty 750

## 2012-10-27 MED ORDER — PLASMA-LYTE 148 IV SOLN
INTRAVENOUS | Status: AC
  Filled 2012-10-27: qty 2.5

## 2012-10-27 MED ORDER — MAGNESIUM SULFATE 50 % IJ SOLN
40.0000 meq | INTRAMUSCULAR | Status: DC
  Filled 2012-10-27 (×2): qty 10

## 2012-10-27 MED ORDER — PHENYLEPHRINE HCL 10 MG/ML IJ SOLN
30.0000 ug/min | INTRAVENOUS | Status: AC
  Administered 2012-10-28: 25 ug/min via INTRAVENOUS
  Filled 2012-10-27 (×2): qty 2

## 2012-10-27 MED ORDER — EPINEPHRINE HCL 1 MG/ML IJ SOLN
0.5000 ug/min | INTRAVENOUS | Status: DC
  Filled 2012-10-27 (×2): qty 4

## 2012-10-27 MED ORDER — POTASSIUM CHLORIDE 2 MEQ/ML IV SOLN
80.0000 meq | INTRAVENOUS | Status: DC
  Filled 2012-10-27 (×2): qty 40

## 2012-10-27 MED ORDER — NITROGLYCERIN IN D5W 200-5 MCG/ML-% IV SOLN
2.0000 ug/min | INTRAVENOUS | Status: DC
  Filled 2012-10-27 (×2): qty 250

## 2012-10-27 MED ORDER — SODIUM CHLORIDE 0.9 % IV SOLN
INTRAVENOUS | Status: AC
  Administered 2012-10-28: 5 mL/h via INTRAVENOUS
  Filled 2012-10-27 (×2): qty 40

## 2012-10-27 MED ORDER — CEFUROXIME SODIUM 1.5 G IJ SOLR
1.5000 g | INTRAMUSCULAR | Status: AC
  Administered 2012-10-28: .75 g via INTRAVENOUS
  Administered 2012-10-28: 1.5 g via INTRAVENOUS
  Filled 2012-10-27 (×2): qty 1.5

## 2012-10-27 MED ORDER — DOPAMINE-DEXTROSE 3.2-5 MG/ML-% IV SOLN
2.0000 ug/kg/min | INTRAVENOUS | Status: DC
  Filled 2012-10-27: qty 250

## 2012-10-27 MED ORDER — SODIUM CHLORIDE 0.9 % IV SOLN
INTRAVENOUS | Status: DC
  Filled 2012-10-27 (×2): qty 30

## 2012-10-27 MED ORDER — VANCOMYCIN HCL 1000 MG IV SOLR
INTRAVENOUS | Status: AC
  Administered 2012-10-28: 10:00:00
  Filled 2012-10-27 (×2): qty 1000

## 2012-10-27 MED ORDER — VANCOMYCIN HCL 10 G IV SOLR
1250.0000 mg | INTRAVENOUS | Status: AC
  Administered 2012-10-28: 1250 mg via INTRAVENOUS
  Filled 2012-10-27: qty 1250

## 2012-10-27 MED ORDER — SODIUM CHLORIDE 0.9 % IV SOLN
INTRAVENOUS | Status: AC
  Administered 2012-10-28: 1.4 [IU]/h via INTRAVENOUS
  Administered 2012-10-28: 1.3 [IU] via INTRAVENOUS
  Filled 2012-10-27 (×2): qty 1

## 2012-10-27 NOTE — H&P (Signed)
[address]       [name] [address]             [phone]          CARDIOTHORACIC SURGERY HISTORY AND PHYSICAL EXAM  Referring Provider is Bruce Pert, MD PCP is Bruce Sauer, MD    Chief Complaint   Patient presents with   .  Aortic Stenosis       surgical eval, TEE 09/01/12, Cardiac Cath 09/08/12      HPI:  Patient is a 42 year old single white male referred by Dr. Jacinto [name] for possible surgical treatment of bicuspid aortic valve with aortic stenosis and aortic regurgitation.  The patient states that he was first noted to have a heart murmur on physical exam while he was in the [name] in 1989. 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  He currently works as an Airline pilot and lives in [place], [place], although he also has a second home locally in [place]. He has been followed for several years by Dr. Felipa [name] who has been checking routine follow up echocardiograms every 2-3 years.  A followup transthoracic echocardiogram was performed that suggested the patient's aortic stenosis had progressed to the point of becoming severe with peak velocity across the valve approaching 4 m/s corresponding to peak and mean transvalvular gradients estimated to be 61 and 40 mm mercury respectively.  Left ventricular ejection fraction was estimated 40%. The patient was referred to Dr. Jacinto [name] who performed both TEE and left and right heart catheterization.  TEE confirmed the presence of what appeared to be a bicuspid aortic valve with at least moderate aortic stenosis and moderate aortic regurgitation. There was at least moderate global left ventricular systolic dysfunction with ejection fraction estimated 25-30%. Left and right heart catheterization was notable for the absence of significant coronary artery disease. There was moderate aortic stenosis with mean gradient across the aortic valve by cath measured 25.5  mm mercury.  There was moderate aortic insufficiency and the thoracic aorta appeared relatively normal sized. Pulmonary artery pressures were normal. The patient was referred for elective surgical consultation.    The patient reports only occasional mild exertional shortness of breath. He remains reasonably active physically and he states that he has not really noticed any significant change in his breathing. He does complain of somewhat decreased energy. He has occasional twinges of tightness that radiated across his chest, particularly on the right side. These, and goes sporadically, last only a few seconds at most, and are not related to physical activity. The patient has occasional palpitations. He sometimes feels mildly uncomfortable breathing and when he lays flat in bed, but he also states that he feels anxious. He has occasional dizzy spells without syncope.  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 He has been seen by several neurologists over the years with concerns for possible TIA or possible variant form of migraine headache.  These episodes have been going on for nearly 6 years and always involve only the right eye, and the patient states that after they occur he does not feel well for several hours or occasionally a day or two.  Patient returns for followup of aortic stenosis and aortic regurgitation with tentative plans to proceed with aortic valve replacement later this week. He was originally seen in consultation on 09/10/2012  and most recently he was seen in the office on 09/30/2012. Since he was last seen in the office he reports no new problems or complaints. He specifically denies any fevers chills or productive cough. He did short of breath with activity including when he is eating. He denies any resting shortness of breath. He has not had any tachypalpitations or dizzy  spells. He reports no other problems or complaints other than the fact that he is a bit anxious regarding his up coming surgery.   Past Medical History  Diagnosis Date  . Heart murmur     bicuspid valve  . Seasonal allergies   . Aortic stenosis   . Aortic regurgitation   . Cardiomyopathy   . Hyperlipidemia     takes Atorvastatin daily  . Shortness of breath     when eating  . Joint swelling   . Arthritis     back and neck  . History of kidney stones   . Anxiety     Xanax prn and takes at night to sleep    Past Surgical History  Procedure Laterality Date  . Lithotripsy    . Tee without cardioversion N/A 09/01/2012    Procedure: TRANSESOPHAGEAL ECHOCARDIOGRAM (TEE);  Surgeon: Bruce Pert, MD;  Location: [facility] ENDOSCOPY;  Service: Cardiovascular;  Laterality: N/A;  h&p in file-Bruce [name]  . Cyst removed from back of spine    . Right hand surgery      Family History  Problem Relation Age of Onset  . Cancer Mother     brain, deceased age 39     Social History History  Substance Use Topics  . Smoking status: Current Some Day Smoker -- 0.25 packs/day    Types: Cigarettes  . Smokeless tobacco: Not on file  . Alcohol Use: 0.0 oz/week    1-2 Shots of liquor per week     Comment: last time 2months ago    Prior to Admission medications   Medication Sig Start Date End Date Taking? Authorizing Provider  ALPRAZolam [facility] [name]) 0.5 MG tablet Take 0.5 mg by mouth at bedtime as needed. anxiety   Yes Historical Provider, MD  atorvastatin (LIPITOR) 80 MG tablet Take 80 mg by mouth daily.  09/18/12  Yes Historical Provider, MD  fluticasone (FLONASE) 50 MCG/ACT nasal spray Place 1 spray into the nose daily.    Yes Historical Provider, MD  Multiple Vitamins-Minerals (AIRBORNE PO) Take by mouth daily.    Historical Provider, MD    Allergies  Allergen Reactions  . Prednisone Swelling and Other (See Comments)    Makes him crazy  . Serzone [Nefazodone Hcl] Swelling and Other (See Comments)     Makes him crazy    Review of Systems:              General:                      normal appetite, decreased energy, no weight gain, no weight loss, no fever             Cardiac:                      No chest pain with exertion, occasional transient chest pain at rest, + SOB with exertion, has resting SOB when reclined, no PND, + orthopnea, no palpitations, no arrhythmia, no atrial fibrillation, no LE edema, many mild dizzy spells one however was more severe in 2008 and caused the patient to fall down  a flight of stairs, no syncope             Respiratory:                Has moderate shortness of breath, no home oxygen, no productive cough, no dry cough, no bronchitis, no wheezing, no hemoptysis, no asthma, no pain with inspiration or cough, no sleep apnea, no CPAP at night             GI:                                no difficulty swallowing, no reflux, no frequent heartburn, no hiatal hernia, no abdominal pain, no constipation, no diarrhea, no hematochezia, no hematemesis, no melena             GU:                              no dysuria,  no frequency, no urinary tract infection, no hematuria, no enlarged prostate, has had 3 kidney stones, no kidney disease             Vascular:                     no pain suggestive of claudication, no pain in feet, no leg cramps, no varicose veins, no DVT, no non-healing foot ulcer             Neuro:                         no stroke, + possible TIA's, + possible seizures, no headaches, has had temporary blindness one right eye has occurred approximately 50 times after the last year alone,  no slurred speech, no peripheral neuropathy, no chronic pain, no instability of gait, no memory/cognitive dysfunction             Musculoskeletal:         Very mild arthritis, no joint swelling, no myalgias, no difficulty walking, normal mobility               Skin:                            Has a rash located on the interior portion of the patients right arm beneath the  armpit, no itching, no skin infections, no pressure sores or ulcerations             Psych:                         + anxiety, no depression, + nervousness, no unusual recent stress             Eyes:                           no blurry vision, no floaters, + recent vision changes, does wear glasses or contacts             ENT:                            no hearing loss, no loose or painful teeth, no dentures, last saw dentist after recent routine check  Hematologic:               no easy bruising, no abnormal bleeding, no clotting disorder, no frequent epistaxis             Endocrine:                   no diabetes, does not check CBG's at home                           Physical Exam:              BP 113/80  Pulse 88  Resp 20  Ht 6' (1.829 m)  Wt 215 lb (97.523 kg)  BMI 29.15 kg/m2  SpO2 98%             General:                      Mildly obese,  well-appearing             HEENT:                       Unremarkable               Neck:                           no JVD, no bruits, no adenopathy               Chest:                         clear to auscultation, symmetrical breath sounds, no wheezes, no rhonchi               CV:                              RRR, grade III/VI systolic murmur               Abdomen:                    soft, non-tender, no masses               Extremities:                 warm, well-perfused, pulses palpable, no LE edema             Rectal/GU                   Deferred             Neuro:                         Grossly non-focal and symmetrical throughout             Skin:                            Clean and dry, no rashes, no breakdown   Diagnostic Tests:  Transthoracic Echocardiography  Patient: Bruce, [name] MR #: [id number] Study Date: 08/12/2012 Gender: M Age: 84 Height: 185.4cm Weight: 96.6kg BSA: 2.34m^2 Pt. Status: Room:  ATTENDING Lucianne Lei, Ravisankar R REFERRING Avva, Ravisankar R PERFORMING [facility], [facility] SONOGRAPHER Erie Noe  Dutch Quint, RDCS cc: Please fax to Dr. Despina Arias in [facility]  ------------------------------------------------------------ LV EF: 40%  ------------------------------------------------------------ Indications: 424.1 Aortic valve disorders. Biscuspid aortic valve 746.4.  ------------------------------------------------------------ History: PMH: Acquired from the patient and from the patient's chart. 2/6 Systolic murmur, visual loss in the right eye, and dizziness with exertion. Bicuspid aortic valve. Mild aortic stenosis.  ------------------------------------------------------------ Study Conclusions  - Left ventricle: The cavity size was moderately dilated. Wall thickness was increased in a pattern of mild LVH. The estimated ejection fraction was 40%. Doppler parameters are consistent with abnormal left ventricular relaxation (grade 1 diastolic dysfunction). - Aortic valve: Valve is possibly bicuspid but cannot tell for sure Cusp separation was reduced. Valve mobility was restricted. There was severe stenosis. Trivial regurgitation. Mean gradient: 40mm Hg (S). Peak gradient: 61mm Hg (S). - Left atrium: The atrium was mildly dilated.  ------------------------------------------------------------ Labs, prior tests, procedures, and surgery: Echocardiography (2010). The study demonstrated LV dilation and LV hypertrophy. The aortic valve showed mild stenosis and mild regurgitation.  Transthoracic echocardiography. M-mode, complete 2D, spectral Doppler, and color Doppler. Height: Height: 185.4cm. Height: 73in. Weight: Weight: 96.6kg. Weight: 212.6lb. Body mass index: BMI: 28.1kg/m^2. Body surface area: BSA: 2.24m^2. Blood pressure: 120/86. Patient status: Outpatient. Location: [facility]  ------------------------------------------------------------  ------------------------------------------------------------ Left ventricle: Mid and  basal inferior wall hypokinesis The cavity size was moderately dilated. Wall thickness was increased in a pattern of mild LVH. The estimated ejection fraction was 40%. Doppler parameters are consistent with abnormal left ventricular relaxation (grade 1 diastolic dysfunction).  ------------------------------------------------------------ Aortic valve: Valve is possibly bicuspid but cannot tell for sure Well visualized. Cusp separation was reduced. Valve mobility was restricted. Doppler: There was severe stenosis. Trivial regurgitation. VTI ratio of LVOT to aortic valve: 0.23. Valve area: 1.64cm^2(VTI). Indexed valve area: 0.74cm^2/m^2 (VTI). Peak velocity ratio of LVOT to aortic valve: 0.24. Valve area: 1.68cm^2 (Vmax). Indexed valve area: 0.76cm^2/m^2 (Vmax). Mean gradient: 40mm Hg (S). Peak gradient: 61mm Hg (S).  ------------------------------------------------------------ Aorta: The aorta was normal, not dilated, and non-diseased.  ------------------------------------------------------------ Mitral valve: Structurally normal valve. Leaflet separation was normal. Doppler: Transvalvular velocity was within the normal range. There was no evidence for stenosis. Trivial regurgitation.  ------------------------------------------------------------ Left atrium: The atrium was mildly dilated.  ------------------------------------------------------------ Right ventricle: The cavity size was normal. Wall thickness was normal. Systolic function was normal.  ------------------------------------------------------------ Pulmonic valve: Structurally normal valve. Cusp separation was normal. Doppler: Transvalvular velocity was within the normal range. Trivial regurgitation.  ------------------------------------------------------------ Tricuspid valve: Structurally normal valve. Leaflet separation was normal. Doppler: Transvalvular velocity was within the normal range. No  regurgitation.  ------------------------------------------------------------ Pulmonary artery: The main pulmonary artery was normal-sized. Systolic pressure was within the normal range.  ------------------------------------------------------------ Right atrium: The atrium was normal in size.  ------------------------------------------------------------ Pericardium: The pericardium was normal in appearance.  ------------------------------------------------------------ Systemic veins: Inferior vena cava: The vessel was normal in size; the respirophasic diameter changes were in the normal range (= 50%); findings are consistent with normal central venous pressure.  ------------------------------------------------------------ Post procedure conclusions Ascending Aorta:  - The aorta was normal, not dilated, and non-diseased.  ------------------------------------------------------------  2D measurements Normal Doppler measurements Normal Left ventricle Left ventricle LVID ED, 59.3 mm 43-52 Ea, lat 12.6 cm/s ------ chord, ann, tiss PLAX DP LVID ES, 51.1 mm 23-38 E/Ea, lat 4.03 ------ chord, ann, tiss PLAX DP FS, chord, 14 % >29 Ea, med 6.47 cm/s ------ PLAX ann, tiss LVPW, ED 13.2 mm ------ DP IVS/LVPW 0.89 <1.3 E/Ea, med 7.85 ------ ratio, ED  ann, tiss Vol ED, 208 ml ------ DP MOD1 LVOT Vol ES, 144 ml ------ Peak vel, 92.5 cm/s ------ MOD1 S EF, MOD1 31 % ------ VTI, S 20.6 cm ------ Vol index, 94 ml/m^2 ------ Stroke vol 145. ml ------ ED, MOD1 6 Vol index, 65 ml/m^2 ------ Stroke 65.9 ml/m^2 ------ ES, MOD1 index Vol ED, 209 ml ------ Aortic valve MOD2 Peak vel, 390 cm/s ------ Vol ES, 141 ml ------ S MOD2 Mean vel, 294 cm/s ------ EF, MOD2 33 % ------ S Stroke 68 ml ------ VTI, S 88.6 cm ------ vol, MOD2 Mean 40 mm Hg ------ Vol index, 95 ml/m^2 ------ gradient, ED, MOD2 S Vol index, 64 ml/m^2 ------ Peak 61 mm Hg ------ ES, MOD2 gradient, Stroke 30.8 ml/m^2  ------ S index, VTI ratio 0.23 ------ MOD2 LVOT/AV Ventricular septum Area, VTI 1.64 cm^2 ------ IVS, ED 11.8 mm ------ Area index 0.74 cm^2/m ------ LVOT (VTI) ^2 Diam, S 30 mm ------ Peak vel 0.24 ------ Area 7.07 cm^2 ------ ratio, Diam 30 mm ------ LVOT/AV Aorta Area, Vmax 1.68 cm^2 ------ Root diam, 32 mm ------ Area index 0.76 cm^2/m ------ ED (Vmax) ^2 AAo AP 36 mm ------ Mitral valve diam, S Peak E vel 50.8 cm/s ------ Left atrium Peak A vel 61.7 cm/s ------ AP dim 40 mm ------ Decelerati 296 ms 150-23 AP dim 1.81 cm/m^2 <2.2 on time 0 index Peak E/A 0.8 ------ ratio Right ventricle Sa vel, 11.6 cm/s ------ lat ann, tiss DP  ------------------------------------------------------------ Prepared and Electronically Authenticated by  Charlton Haws 2014-06-25T15:10:[id number]    Transesophageal Echocardiography  Patient: Bruce, [name] MR #: [id number] Study Date: 09/01/2012 Gender: M Age: 42 Height: 182.9cm Weight: 95.5kg BSA: 2.25m^2 Pt. Status: Room: [facility]  PERFORMING Yates Decamp SONOGRAPHER Jeryl Columbia Valrie Hart, Tyrone Nine cc:  ------------------------------------------------------------ LV EF: 25% - 30%  ------------------------------------------------------------ Indications: Aortic stenosis 424.1.  ------------------------------------------------------------ Study Conclusions  - Left ventricle: The cavity size was mildly dilated. There was mild concentric hypertrophy. Systolic function was severely reduced. The estimated ejection fraction was in the range of 25% to 30%. Diffuse hypokinesis. - Aortic valve: Mildly calcified annulus. Possibly bicuspid; moderately calcified leaflets. Calcification. Cusp separation was severely reduced. There was severe stenosis. Valve area: 0.69cm^2(VTI). Valve area: 0.66cm^2 (Vmax). Transesophageal echocardiography. 2D and color Doppler. Height: Height: 182.9cm. Height: 72in. Weight: Weight: 95.5kg.  Weight: 210lb. Body mass index: BMI: 28.5kg/m^2. Body surface area: BSA: 2.45m^2. Blood pressure: 112/83. Patient status: Inpatient. Location: Endoscopy.  ------------------------------------------------------------  ------------------------------------------------------------ Left ventricle: The cavity size was mildly dilated. There was mild concentric hypertrophy. Systolic function was severely reduced. The estimated ejection fraction was in the range of 25% to 30%. Diffuse hypokinesis.  ------------------------------------------------------------ Aortic valve: Not well visualized. Mildly calcified annulus. Possibly bicuspid; moderately calcified leaflets. Calcification. Cusp separation was severely reduced. Doppler: There was severe stenosis. VTI ratio of LVOT to aortic valve: 0.17. Valve area: 0.69cm^2(VTI). Indexed valve area: 0.31cm^2/m^2 (VTI). Peak velocity ratio of LVOT to aortic valve: 0.16. Valve area: 0.66cm^2 (Vmax). Indexed valve area: 0.3cm^2/m^2 (Vmax). Mean gradient: 32mm Hg (S). Peak gradient: 52mm Hg (S).  ------------------------------------------------------------ Aorta: There was no atheroma. There was no evidence for dissection. Aortic root: The aortic root was not dilated. Ascending aorta: The ascending aorta was normal in size. Aortic arch: The aortic arch was normal in size. Descending aorta: The descending aorta was normal in size.  ------------------------------------------------------------ Mitral valve: Structurally normal valve. Leaflet separation was normal. Doppler: No significant regurgitation.  ------------------------------------------------------------ Left atrium: The atrium was normal in size. No  evidence of thrombus in the atrial cavity or appendage. The appendage was morphologically a left appendage, multilobulated, and of normal size. Emptying velocity was normal.  ------------------------------------------------------------ Right  ventricle: The cavity size was normal. Wall thickness was normal. Systolic function was normal.  ------------------------------------------------------------ Pulmonic valve: Structurally normal valve.  ------------------------------------------------------------ Tricuspid valve: Structurally normal valve. Leaflet separation was normal. Doppler: No significant regurgitation.  ------------------------------------------------------------ Pulmonary artery: The main pulmonary artery was normal-sized.  ------------------------------------------------------------ Right atrium: The atrium was normal in size. No evidence of thrombus in the atrial cavity or appendage. The appendage was morphologically a right appendage.  ------------------------------------------------------------ Pericardium: There was no pericardial effusion.  ------------------------------------------------------------  2D measurements Normal Doppler measurements Norma LVOT l Diam, S 23 mm ------ LVOT Area 4.15 cm^2 ------ Peak vel, 57. cm/s ----- S 8 VTI, S 12. cm ----- 9 Aortic valve Peak vel, 361 cm/s ----- S Mean vel, 261 cm/s ----- S VTI, S 77. cm ----- 2 Mean 32 mm Hg ----- gradient, S Peak 52 mm Hg ----- gradient, S VTI ratio 0.1 ----- LVOT/AV 7 Area, VTI 0.6 cm^2 ----- 9 Area 0.3 cm^2/m^2 ----- index 1 (VTI) Peak vel 0.1 ----- ratio, 6 LVOT/AV Area, 0.6 cm^2 ----- Vmax 6 Area 0.3 cm^2/m^2 ----- index (Vmax)  ------------------------------------------------------------ Prepared and Electronically Authenticated by  Yates Decamp 2014-07-15T18:44:[id number]    CARDIAC CATHETERIZATION    Procedures performed: Right and left heart catheterization and occlusion of cardiac output and cardiac index by Fick. Right radial arterial access for left heart catheterization and right antecubital vein access for right heart catheterization was utilized for performing the procedure.    Indication:  Patient is a 42 year-old , male with recently diagnosed cardiomyopathy with severe LV systolic dysfunction, severe aortic stenosis, bicuspid aortic valve. Due to symptomatic severe aortic stenosis, patient underwent TEE, which confirmed severe bicuspid aortic valve stenosis and mild to moderate aortic regurgitation. Hence was scheduled for left and right heart catheterization for preoperative cardiac evaluation. Ascending aortogram and descending thoracic aortogram was performed to evaluate for presence of aneurysm, coarctation of the aorta, to evaluate surgical feasibility for lateral thoracotomy approach for aortic valve replacement.   Procedural data:   RA pressure 10/9 Mean 7 mm mercury. RA saturation 67%.   RV pressure 25/5 and Right ventricular EDP 9 mm Hg.   PA pressure 21/12 with a mean of 16 mm mercury. PA saturation 70%.   Pulmonary capillary wedge 15/15 with a mean of 13 mm Hg. Aortic saturation 97%.   Cardiac output was 4.43 with cardiac index of 2.01 by Fick.   Aortic valve area cavitated at 1.12 cm2. Peak to peak gradient was 44 mmHg, mean gradient was 25.5 mmHg.   Left heart catheterization hemodynamic data: Left ventricle pressure 156/9 with end-diastolic pressure of 15 mmHg.aortic pressure was 131/75 with a mean of 99 mmHg. Aortic valve area cavitated at 1.12 cm2. Peak to peak gradient was 44 mmHg, mean gradient was 25.5 mmHg.   Angiographic data   Left ventricle: performed. Left systolic shows normal ejection fraction of 35-40% The left ventricle is dilated moderately.   Right coronary artery: Smooth, dominant and normal. Large proximal RV branch noted.   Left main coronary artery: Normal. No stenosis.   LAD: Large, smooth and normal. Gives origin to a moderate sized diagonal 1 which is smooth and normal.   Circumflex coronary artery: Smooth normal. Non-dominant.  Ascending aortogram/ascending thoracic aortogram: Moderate aortic valve calcification evident. Moderate aortic  regurgitation. Descending thoracic and abdominal aorta reveals no  evidence of abdominal aortic aneurysm or aortic coarctation.   Impression: Normal left and right heart catheterizaton with normal coronary arteries.  Moderate aortic regurgitation, moderate aortic stenosis with moderate to severe LV systolic dysfunction, LV dilatation. Congenital bicuspid aortic valve without any evidence of ascending or descending thoracic or abdominal aortic aneurysm.  Recommendation: As patient has left ventricular systolic dysfunction, patient needs identified replacement. Patient will be referred for cardio-thoracic surgical consultation.   Technique:  A 5 French brachial sheath introduced into right AC vein access. A 5 French Swan-Ganz catheter was advanced with balloon inflated on the sheath under fluoroscopic guidance into first the right atrium followed by the right ventricle and into the pulmonary artery to pulmonary artery wedge position. Hemodynamics were obtained in a locations. After hemodynamics were completed, samples were taken for SaO2% measurement to be used in [facility] /Index catheterization. The catheter was then pulled back the balloon down and then completely out of the body.   Left Heart Catheterization   First a 5 [place] TIG 4 catheter was advanced over standard J-wire into the ascending aorta and used to engage first the Left and Right Coronary Artery. Multiple cineangiographic views of the Left then Right Coronary Artery system(s) were performed.   A 5 French angled pigtail catheter which was used to cross the aortic valve for measurement Left Ventricular Hemodynamics. I utilized a 0.035 inch versacore wire for crossing the aortic valve. Left ventriculography was then performed in the RAO projection. Hemodynamics were then resampled and the catheter pulled back across the aortic valve for measurement of "pullback" gradient. The catheter was then removed the body over wire. All exchanges were made over  standard J wire. Hemostasis was obtained by applying TR band on the right radial arterial access and manual pressure was held at the venous access site.          CT ANGIOGRAPHY CHEST    Technique: Multidetector CT imaging of the chest using the   standard protocol during bolus administration of intravenous   contrast. Multiplanar reconstructed images including MIPs were   obtained and reviewed to evaluate the vascular anatomy.   Contrast: 80mL OMNIPAQUE IOHEXOL 350 MG/ML SOLN   Comparison: Prior CT scan of the abdomen pelvis 04/25/2010   Findings:   Mediastinum: Unremarkable CT appearance of the thyroid gland. No   suspicious mediastinal or hilar adenopathy. No soft tissue   mediastinal mass. The thoracic esophagus is unremarkable.   Heart/Vascular: Conventional three-vessel arch anatomy. No   evidence of significant aortic root dilatation or aneurysmal   dilatation of the ascending aorta. The aortic root measures 3.7 cm   at the sinuses of Valsalva and at 2.9 cm at the sinotubular   junction. Maximal diameter of the tubular portion of the ascending   aorta is 3.2 cm. The transverse aorta measures up to 2.8 cm. The   descending thoracic aorta measures 2.2 cm. The origin of the left   subclavian artery is ectatic at 1.8 cm. 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 There is   dense calcification and thickening of the aortic valve consistent   with a bicuspid morphology. Although limited by non cardiac gated   technique. There is a suggestion of calcification along the course   of the left anterior descending coronary artery. Mild diffuse   thickening of the left ventricular myocardium. The heart itself  is   within normal limits for size. No pericardial effusion.   Lungs/Pleura: Small 4 mm subpleural nodule associated with the   right major fissure is almost certainly a  subpleural lymph node.   The lungs are otherwise clear.   Upper Abdomen: The visualized upper abdomen is unremarkable.   Bones: No acute fracture or aggressive appearing lytic or blastic   osseous lesion.   IMPRESSION:   1. Calcification and thickening of the aortic valve consistent   with bicuspid morphology.   2. No significant dilatation of the aortic root or ascending   aorta.   3. 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   4. Suggestion of calcification along the course of the left   anterior descending coronary artery. Evaluation is limited by non   gated cardiac technique, however coronary artery disease is   suspected. If clinically warranted, dedicated cardiac CTA could   further evaluate.   Original Report Authenticated By: Malachy Moan, M.D.     Impression:  The patient has a bicuspid aortic valve with moderate to severe aortic stenosis with at least moderate aortic regurgitation that is now associated with significant left ventricular systolic dysfunction. The patient has relatively mild symptomatology with only mild exertional shortness of breath and some reported generalized fatigue and atypical chest pains. He also reports some dizzy spells. Options include proceeding with elective surgical intervention versus continued close observation and followup. Because of the recent fall in left ventricular systolic function I would be inclined to recommend proceeding with surgery sooner rather than later. CT angiogram of the chest is notable for the absence of significant aneurysmal enlargement of the ascending thoracic aorta.  Surgical options include conventional surgical aortic valve replacement using either a mechanical prosthesis or a bioprosthetic tissue valve through either conventional sternotomy or minimally invasive techniques. In addition, the patient might be a reasonable  candidate for a Ross procedure.  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 I am skeptical that these episodes are embolic TIAs from the patient's aortic valve, and transesophageal echocardiogram was notable for the absence of any other potential source.     Plan:  The patient and his friend were counseled at length regarding alternatives with respect to valve replacement including continued medical therapy versus proceeding with conventional surgical aortic valve replacement using either a mechanical prosthesis or a bioprosthetic tissue valve. Other alternatives including the Ross autograft procedure, homograft aortic root replacement, stentless bioprosthetic tissue valve replacement, valve repair, and transcatheter aortic valve replacement were discussed. Discussion was held comparing the relative risks of mechanical valve replacement with need for lifelong anticoagulation versus use of a bioprosthetic tissue valve and the associated potential for late structural valve deterioration in failure. Alternative surgical approaches have been discussed, including a comparison between conventional sternotomy and minimally-invasive techniques. The relative risks and benefits of each have been reviewed as they pertain to the patient's specific circumstances, and all of their questions have been addressed.  The patient has decided that he wants to undergo elective aortic valve replacement using a bioprosthetic tissue valve with minimally invasive surgical techniques. We spent in excess of 60 minutes discussing options and the risks and benefits associated with both use of the bioprosthetic tissue valve and the minimally invasive approach for surgery.  Expectations for his  postoperative recovery has been discussed at length.  The patient understands and accepts all potential associated risks of surgery including but not limited to risk  of death, stroke, myocardial infarction, congestive heart failure, respiratory failure, renal failure, bleeding requiring blood transfusion and/or reexploration, arrhythmia, heart block or bradycardia requiring permanent pacemaker, pneumonia, pleural effusion, wound infection, pulmonary embolus or other thromboembolic complication, chronic pain or other delayed complications related to valve replacement including paravalvular leak, prosthetic valve endocarditis, and/or structural deterioration and failure requiring repeat surgical intervention.  All questions answered.    Salvatore Decent. Cornelius Moras, MD 10/26/2012 4:50 PM

## 2012-10-28 ENCOUNTER — Encounter (HOSPITAL_COMMUNITY): Payer: Self-pay | Admitting: Anesthesiology

## 2012-10-28 ENCOUNTER — Inpatient Hospital Stay (HOSPITAL_COMMUNITY): Payer: BC Managed Care – PPO | Admitting: Anesthesiology

## 2012-10-28 ENCOUNTER — Encounter (HOSPITAL_COMMUNITY)
Admission: RE | Disposition: A | Discharge status: Home / Self Care | Payer: BC Managed Care – PPO | Source: Ambulatory Visit | Attending: Thoracic Surgery (Cardiothoracic Vascular Surgery)

## 2012-10-28 ENCOUNTER — Inpatient Hospital Stay (HOSPITAL_COMMUNITY)
Admission: RE | Admit: 2012-10-28 | Discharge: 2012-11-01 | DRG: 105 | Disposition: A | Discharge status: Home / Self Care | Payer: BC Managed Care – PPO | Source: Ambulatory Visit | Attending: Thoracic Surgery (Cardiothoracic Vascular Surgery) | Admitting: Thoracic Surgery (Cardiothoracic Vascular Surgery)

## 2012-10-28 ENCOUNTER — Encounter (HOSPITAL_COMMUNITY): Payer: Self-pay | Admitting: *Deleted

## 2012-10-28 ENCOUNTER — Inpatient Hospital Stay (HOSPITAL_COMMUNITY): Payer: BC Managed Care – PPO

## 2012-10-28 DIAGNOSIS — J988 Other specified respiratory disorders: Secondary | ICD-10-CM | POA: Diagnosis not present

## 2012-10-28 DIAGNOSIS — Y849 Medical procedure, unspecified as the cause of abnormal reaction of the patient, or of later complication, without mention of misadventure at the time of the procedure: Secondary | ICD-10-CM | POA: Clinically undetermined

## 2012-10-28 DIAGNOSIS — Z952 Presence of prosthetic heart valve: Secondary | ICD-10-CM

## 2012-10-28 DIAGNOSIS — J9819 Other pulmonary collapse: Secondary | ICD-10-CM | POA: Diagnosis not present

## 2012-10-28 DIAGNOSIS — Z953 Presence of xenogenic heart valve: Secondary | ICD-10-CM

## 2012-10-28 DIAGNOSIS — I350 Nonrheumatic aortic (valve) stenosis: Secondary | ICD-10-CM | POA: Diagnosis present

## 2012-10-28 DIAGNOSIS — E785 Hyperlipidemia, unspecified: Secondary | ICD-10-CM | POA: Diagnosis present

## 2012-10-28 DIAGNOSIS — F411 Generalized anxiety disorder: Secondary | ICD-10-CM | POA: Diagnosis present

## 2012-10-28 DIAGNOSIS — I351 Nonrheumatic aortic (valve) insufficiency: Secondary | ICD-10-CM | POA: Diagnosis present

## 2012-10-28 DIAGNOSIS — D62 Acute posthemorrhagic anemia: Secondary | ICD-10-CM | POA: Diagnosis not present

## 2012-10-28 DIAGNOSIS — I359 Nonrheumatic aortic valve disorder, unspecified: Secondary | ICD-10-CM

## 2012-10-28 DIAGNOSIS — F17200 Nicotine dependence, unspecified, uncomplicated: Secondary | ICD-10-CM | POA: Diagnosis present

## 2012-10-28 DIAGNOSIS — D72829 Elevated white blood cell count, unspecified: Secondary | ICD-10-CM | POA: Diagnosis present

## 2012-10-28 DIAGNOSIS — I428 Other cardiomyopathies: Secondary | ICD-10-CM | POA: Diagnosis present

## 2012-10-28 HISTORY — PX: INTRAOPERATIVE TRANSESOPHAGEAL ECHOCARDIOGRAM: SHX5062

## 2012-10-28 HISTORY — DX: Presence of prosthetic heart valve: Z95.2

## 2012-10-28 HISTORY — PX: AORTIC VALVE REPLACEMENT: SHX41

## 2012-10-28 HISTORY — DX: Presence of xenogenic heart valve: Z95.3

## 2012-10-28 LAB — POCT I-STAT 4, (NA,K, GLUC, HGB,HCT)
Glucose, Bld: 103 mg/dL — ABNORMAL HIGH (ref 70–99)
Glucose, Bld: 150 mg/dL — ABNORMAL HIGH (ref 70–99)
Glucose, Bld: 110 mg/dL — ABNORMAL HIGH (ref 70–99)
Glucose, Bld: 72 mg/dL (ref 70–99)
HCT: 43 % (ref 39.0–52.0)
HCT: 33 % — ABNORMAL LOW (ref 39.0–52.0)
HCT: 36 % — ABNORMAL LOW (ref 39.0–52.0)
HCT: 35 % — ABNORMAL LOW (ref 39.0–52.0)
Hemoglobin: 14.6 g/dL (ref 13.0–17.0)
Hemoglobin: 11.2 g/dL — ABNORMAL LOW (ref 13.0–17.0)
Potassium: 4.1 mEq/L (ref 3.5–5.1)
Sodium: 142 mEq/L (ref 135–145)
Sodium: 131 mEq/L — ABNORMAL LOW (ref 135–145)
Sodium: 139 mEq/L (ref 135–145)

## 2012-10-28 LAB — POCT I-STAT 3, ART BLOOD GAS (G3+)
Acid-base deficit: 4 mmol/L — ABNORMAL HIGH (ref 0.0–2.0)
Bicarbonate: 23.2 mEq/L (ref 20.0–24.0)
Bicarbonate: 22 mEq/L (ref 20.0–24.0)
Bicarbonate: 20.8 mEq/L (ref 20.0–24.0)
O2 Saturation: 87 %
O2 Saturation: 93 %
Patient temperature: 36.7
TCO2: 25 mmol/L (ref 0–100)
TCO2: 23 mmol/L (ref 0–100)
TCO2: 22 mmol/L (ref 0–100)
pCO2 arterial: 47.9 mmHg — ABNORMAL HIGH (ref 35.0–45.0)
pCO2 arterial: 45.6 mmHg — ABNORMAL HIGH (ref 35.0–45.0)
pCO2 arterial: 40.6 mmHg (ref 35.0–45.0)
pH, Arterial: 7.278 — ABNORMAL LOW (ref 7.350–7.450)
pO2, Arterial: 55 mmHg — ABNORMAL LOW (ref 80.0–100.0)
pO2, Arterial: 68 mmHg — ABNORMAL LOW (ref 80.0–100.0)
pO2, Arterial: 76 mmHg — ABNORMAL LOW (ref 80.0–100.0)

## 2012-10-28 LAB — CBC
MCH: 29.8 pg (ref 26.0–34.0)
Platelets: 111 10*3/uL — ABNORMAL LOW (ref 150–400)
RBC: 4.03 MIL/uL — ABNORMAL LOW (ref 4.22–5.81)
WBC: 16.1 10*3/uL — ABNORMAL HIGH (ref 4.0–10.5)

## 2012-10-28 LAB — HEMOGLOBIN AND HEMATOCRIT, BLOOD: Hemoglobin: 11.7 g/dL — ABNORMAL LOW (ref 13.0–17.0)

## 2012-10-28 LAB — POCT I-STAT GLUCOSE: Glucose, Bld: 181 mg/dL — ABNORMAL HIGH (ref 70–99)

## 2012-10-28 LAB — PROTIME-INR: Prothrombin Time: 17 seconds — ABNORMAL HIGH (ref 11.6–15.2)

## 2012-10-28 LAB — PLATELET COUNT: Platelets: 144 10*3/uL — ABNORMAL LOW (ref 150–400)

## 2012-10-28 SURGERY — REPLACEMENT, AORTIC VALVE, MINIMALLY INVASIVE
Anesthesia: General | Site: Chest | Wound class: Clean

## 2012-10-28 MED ORDER — SODIUM CHLORIDE 0.9 % IV SOLN
INTRAVENOUS | Status: DC
  Administered 2012-10-28: 0.6 [IU]/h via INTRAVENOUS
  Administered 2012-10-28: 1.4 [IU]/h via INTRAVENOUS
  Administered 2012-10-28: 1.8 [IU]/h via INTRAVENOUS
  Administered 2012-10-28: 2.5 [IU]/h via INTRAVENOUS

## 2012-10-28 MED ORDER — HEPARIN SODIUM (PORCINE) 1000 UNIT/ML IJ SOLN
INTRAMUSCULAR | Status: DC | PRN
  Administered 2012-10-28: 28000 [IU] via INTRAVENOUS

## 2012-10-28 MED ORDER — ONDANSETRON HCL 4 MG/2ML IJ SOLN
4.0000 mg | Freq: Four times a day (QID) | INTRAMUSCULAR | Status: DC | PRN
  Filled 2012-10-28: qty 2

## 2012-10-28 MED ORDER — ACETAMINOPHEN 160 MG/5ML PO SOLN
1000.0000 mg | Freq: Four times a day (QID) | ORAL | Status: DC
  Filled 2012-10-28: qty 40

## 2012-10-28 MED ORDER — LACTATED RINGERS IV SOLN
INTRAVENOUS | Status: DC
  Administered 2012-10-28: 18:00:00 via INTRAVENOUS

## 2012-10-28 MED ORDER — MIDAZOLAM HCL 5 MG/5ML IJ SOLN
INTRAMUSCULAR | Status: DC | PRN
  Administered 2012-10-28: 2 mg via INTRAVENOUS
  Administered 2012-10-28: 3 mg via INTRAVENOUS
  Administered 2012-10-28 (×2): 2 mg via INTRAVENOUS
  Administered 2012-10-28: 1 mg via INTRAVENOUS
  Administered 2012-10-28: 5 mg via INTRAVENOUS

## 2012-10-28 MED ORDER — ALBUMIN HUMAN 5 % IV SOLN
250.0000 mL | INTRAVENOUS | Status: AC | PRN
  Filled 2012-10-28: qty 250

## 2012-10-28 MED ORDER — VECURONIUM BROMIDE 10 MG IV SOLR
INTRAVENOUS | Status: DC | PRN
  Administered 2012-10-28 (×3): 3 mg via INTRAVENOUS
  Administered 2012-10-28 (×2): 2 mg via INTRAVENOUS
  Administered 2012-10-28: 5 mg via INTRAVENOUS
  Administered 2012-10-28: 2 mg via INTRAVENOUS

## 2012-10-28 MED ORDER — INSULIN ASPART 100 UNIT/ML ~~LOC~~ SOLN: 0.0000 [IU] | SUBCUTANEOUS | Status: DC

## 2012-10-28 MED ORDER — ALBUTEROL SULFATE (5 MG/ML) 0.5% IN NEBU
2.5000 mg | INHALATION_SOLUTION | Freq: Four times a day (QID) | RESPIRATORY_TRACT | Status: DC
  Administered 2012-10-28 – 2012-10-29 (×4): 2.5 mg via RESPIRATORY_TRACT
  Filled 2012-10-28 (×8): qty 0.5

## 2012-10-28 MED ORDER — METOPROLOL TARTRATE 25 MG/10 ML ORAL SUSPENSION
12.5000 mg | Freq: Two times a day (BID) | ORAL | Status: DC
  Filled 2012-10-28 (×3): qty 5

## 2012-10-28 MED ORDER — LACTATED RINGERS IV SOLN: 500.0000 mL | Freq: Once | INTRAVENOUS | Status: AC | PRN

## 2012-10-28 MED ORDER — DOCUSATE SODIUM 100 MG PO CAPS
200.0000 mg | ORAL_CAPSULE | Freq: Every day | ORAL | Status: DC
  Administered 2012-10-29 – 2012-10-30 (×2): 200 mg via ORAL
  Filled 2012-10-28 (×4): qty 2

## 2012-10-28 MED ORDER — PROPOFOL 10 MG/ML IV BOLUS
INTRAVENOUS | Status: DC | PRN
  Administered 2012-10-28: 100 mg via INTRAVENOUS

## 2012-10-28 MED ORDER — MORPHINE SULFATE 2 MG/ML IJ SOLN
2.0000 mg | INTRAMUSCULAR | Status: DC | PRN
  Administered 2012-10-29 (×2): 4 mg via INTRAVENOUS
  Filled 2012-10-28: qty 2
  Filled 2012-10-28: qty 1
  Filled 2012-10-28: qty 2

## 2012-10-28 MED ORDER — MORPHINE SULFATE 2 MG/ML IJ SOLN
1.0000 mg | INTRAMUSCULAR | Status: AC | PRN
  Administered 2012-10-28: 4 mg via INTRAVENOUS
  Administered 2012-10-28: 2 mg via INTRAVENOUS
  Administered 2012-10-28: 4 mg via INTRAVENOUS
  Administered 2012-10-29: 2 mg via INTRAVENOUS
  Filled 2012-10-28 (×2): qty 2
  Filled 2012-10-28: qty 1

## 2012-10-28 MED ORDER — PLASMA-LYTE 148 IV SOLN
INTRAVENOUS | Status: AC
  Administered 2012-10-28: 10:00:00
  Filled 2012-10-28: qty 2.5

## 2012-10-28 MED ORDER — ASPIRIN 81 MG PO CHEW: 324.0000 mg | CHEWABLE_TABLET | Freq: Every day | ORAL | Status: DC

## 2012-10-28 MED ORDER — KETOROLAC TROMETHAMINE 30 MG/ML IJ SOLN
30.0000 mg | Freq: Four times a day (QID) | INTRAMUSCULAR | Status: AC
  Administered 2012-10-29 – 2012-10-30 (×4): 30 mg via INTRAVENOUS
  Filled 2012-10-28 (×6): qty 1

## 2012-10-28 MED ORDER — PANTOPRAZOLE SODIUM 40 MG PO TBEC
40.0000 mg | DELAYED_RELEASE_TABLET | Freq: Every day | ORAL | Status: DC
  Administered 2012-10-30 – 2012-11-01 (×3): 40 mg via ORAL
  Filled 2012-10-28 (×3): qty 1

## 2012-10-28 MED ORDER — DEXMEDETOMIDINE HCL IN NACL 200 MCG/50ML IV SOLN
0.4000 ug/kg/h | INTRAVENOUS | Status: DC
  Filled 2012-10-28: qty 50

## 2012-10-28 MED ORDER — DEXMEDETOMIDINE HCL IN NACL 200 MCG/50ML IV SOLN
0.1000 ug/kg/h | INTRAVENOUS | Status: DC
  Administered 2012-10-28: 0.3 ug/kg/h via INTRAVENOUS
  Administered 2012-10-29: 0.2 ug/kg/h via INTRAVENOUS
  Filled 2012-10-28 (×2): qty 50

## 2012-10-28 MED ORDER — ROCURONIUM BROMIDE 100 MG/10ML IV SOLN
INTRAVENOUS | Status: DC | PRN
  Administered 2012-10-28: 20 mg via INTRAVENOUS
  Administered 2012-10-28 (×2): 50 mg via INTRAVENOUS

## 2012-10-28 MED ORDER — DEXTROSE 5 % IV SOLN
1.5000 g | Freq: Two times a day (BID) | INTRAVENOUS | Status: AC
  Administered 2012-10-28 – 2012-10-30 (×4): 1.5 g via INTRAVENOUS
  Filled 2012-10-28 (×5): qty 1.5

## 2012-10-28 MED ORDER — SODIUM CHLORIDE 0.9 % IJ SOLN: 3.0000 mL | INTRAMUSCULAR | Status: DC | PRN

## 2012-10-28 MED ORDER — ACETAMINOPHEN 160 MG/5ML PO SOLN: 650.0000 mg | Freq: Once | ORAL | Status: AC

## 2012-10-28 MED ORDER — ARTIFICIAL TEARS OP OINT
TOPICAL_OINTMENT | OPHTHALMIC | Status: DC | PRN
  Administered 2012-10-28: 1 via OPHTHALMIC

## 2012-10-28 MED ORDER — POTASSIUM CHLORIDE 10 MEQ/50ML IV SOLN
10.0000 meq | INTRAVENOUS | Status: AC
  Administered 2012-10-28 (×3): 10 meq via INTRAVENOUS

## 2012-10-28 MED ORDER — ASPIRIN EC 325 MG PO TBEC
325.0000 mg | DELAYED_RELEASE_TABLET | Freq: Every day | ORAL | Status: DC
  Administered 2012-10-29 – 2012-11-01 (×4): 325 mg via ORAL
  Filled 2012-10-28 (×4): qty 1

## 2012-10-28 MED ORDER — SODIUM CHLORIDE 0.9 % IV SOLN
INTRAVENOUS | Status: DC
  Administered 2012-10-28: 17:00:00 via INTRAVENOUS

## 2012-10-28 MED ORDER — PROTAMINE SULFATE 10 MG/ML IV SOLN
INTRAVENOUS | Status: DC | PRN
  Administered 2012-10-28: 20 mg via INTRAVENOUS
  Administered 2012-10-28 (×7): 30 mg via INTRAVENOUS
  Administered 2012-10-28: 20 mg via INTRAVENOUS

## 2012-10-28 MED ORDER — MIDAZOLAM HCL 2 MG/2ML IJ SOLN: 2.0000 mg | INTRAMUSCULAR | Status: DC | PRN

## 2012-10-28 MED ORDER — 0.9 % SODIUM CHLORIDE (POUR BTL) OPTIME
TOPICAL | Status: DC | PRN
  Administered 2012-10-28: 6000 mL

## 2012-10-28 MED ORDER — PHENYLEPHRINE HCL 10 MG/ML IJ SOLN
0.0000 ug/min | INTRAVENOUS | Status: DC
  Filled 2012-10-28: qty 2

## 2012-10-28 MED ORDER — KETOROLAC TROMETHAMINE 30 MG/ML IJ SOLN
30.0000 mg | Freq: Once | INTRAMUSCULAR | Status: AC | PRN
  Administered 2012-10-28: 30 mg via INTRAVENOUS
  Filled 2012-10-28: qty 1

## 2012-10-28 MED ORDER — OXYCODONE HCL 5 MG PO TABS
5.0000 mg | ORAL_TABLET | ORAL | Status: DC | PRN
  Administered 2012-10-29 – 2012-10-31 (×8): 10 mg via ORAL
  Administered 2012-11-01: 5 mg via ORAL
  Filled 2012-10-28 (×8): qty 2
  Filled 2012-10-28: qty 1
  Filled 2012-10-28: qty 2

## 2012-10-28 MED ORDER — LACTATED RINGERS IV SOLN
INTRAVENOUS | Status: DC | PRN
  Administered 2012-10-28: 08:00:00 via INTRAVENOUS

## 2012-10-28 MED ORDER — SODIUM CHLORIDE 0.9 % IV SOLN
INTRAVENOUS | Status: DC | PRN
  Administered 2012-10-28: 15:00:00 via INTRAVENOUS

## 2012-10-28 MED ORDER — NITROGLYCERIN IN D5W 200-5 MCG/ML-% IV SOLN
0.0000 ug/min | INTRAVENOUS | Status: DC
  Administered 2012-10-28: 40 ug/min via INTRAVENOUS
  Administered 2012-10-28: 30 ug/min via INTRAVENOUS
  Administered 2012-10-28: 60 ug/min via INTRAVENOUS

## 2012-10-28 MED ORDER — SODIUM CHLORIDE 0.45 % IV SOLN
INTRAVENOUS | Status: DC
  Administered 2012-10-28: 17:00:00 via INTRAVENOUS

## 2012-10-28 MED ORDER — ALBUMIN HUMAN 5 % IV SOLN
INTRAVENOUS | Status: DC | PRN
  Administered 2012-10-28 (×2): via INTRAVENOUS

## 2012-10-28 MED ORDER — INSULIN REGULAR BOLUS VIA INFUSION: 0.0000 [IU] | Freq: Three times a day (TID) | INTRAVENOUS | Status: DC

## 2012-10-28 MED ORDER — SODIUM CHLORIDE 0.9 % IV SOLN
5.0000 g/h | Freq: Once | INTRAVENOUS | Status: DC
  Filled 2012-10-28: qty 20

## 2012-10-28 MED ORDER — BISACODYL 5 MG PO TBEC
10.0000 mg | DELAYED_RELEASE_TABLET | Freq: Every day | ORAL | Status: DC
  Administered 2012-10-29 – 2012-10-30 (×2): 10 mg via ORAL
  Filled 2012-10-28 (×4): qty 2

## 2012-10-28 MED ORDER — SODIUM CHLORIDE 0.9 % IV SOLN: 250.0000 mL | INTRAVENOUS | Status: DC

## 2012-10-28 MED ORDER — METOPROLOL TARTRATE 12.5 MG HALF TABLET
12.5000 mg | ORAL_TABLET | Freq: Once | ORAL | Status: AC
  Administered 2012-10-28: 12.5 mg via ORAL
  Filled 2012-10-28: qty 1

## 2012-10-28 MED ORDER — BISACODYL 10 MG RE SUPP: 10.0000 mg | Freq: Every day | RECTAL | Status: DC

## 2012-10-28 MED ORDER — VANCOMYCIN HCL IN DEXTROSE 1-5 GM/200ML-% IV SOLN
1000.0000 mg | Freq: Once | INTRAVENOUS | Status: AC
  Administered 2012-10-29: 1000 mg via INTRAVENOUS
  Filled 2012-10-28: qty 200

## 2012-10-28 MED ORDER — SODIUM CHLORIDE 0.9 % IJ SOLN
3.0000 mL | Freq: Two times a day (BID) | INTRAMUSCULAR | Status: DC
  Administered 2012-10-29 – 2012-10-30 (×2): 3 mL via INTRAVENOUS

## 2012-10-28 MED ORDER — ACETAMINOPHEN 500 MG PO TABS
1000.0000 mg | ORAL_TABLET | Freq: Four times a day (QID) | ORAL | Status: DC
  Administered 2012-10-29 (×4): 1000 mg via ORAL
  Administered 2012-10-30: 500 mg via ORAL
  Administered 2012-10-30 – 2012-11-01 (×8): 1000 mg via ORAL
  Filled 2012-10-28 (×18): qty 2

## 2012-10-28 MED ORDER — METOPROLOL TARTRATE 1 MG/ML IV SOLN: 2.5000 mg | INTRAVENOUS | Status: DC | PRN

## 2012-10-28 MED ORDER — METOPROLOL TARTRATE 12.5 MG HALF TABLET
12.5000 mg | ORAL_TABLET | Freq: Two times a day (BID) | ORAL | Status: DC
  Filled 2012-10-28 (×3): qty 1

## 2012-10-28 MED ORDER — FAMOTIDINE IN NACL 20-0.9 MG/50ML-% IV SOLN
20.0000 mg | Freq: Two times a day (BID) | INTRAVENOUS | Status: AC
  Administered 2012-10-28 – 2012-10-29 (×2): 20 mg via INTRAVENOUS
  Filled 2012-10-28: qty 50

## 2012-10-28 MED ORDER — MAGNESIUM SULFATE 40 MG/ML IJ SOLN
4.0000 g | Freq: Once | INTRAMUSCULAR | Status: AC
  Administered 2012-10-28: 4 g via INTRAVENOUS
  Filled 2012-10-28: qty 100

## 2012-10-28 MED ORDER — ACETAMINOPHEN 650 MG RE SUPP
650.0000 mg | Freq: Once | RECTAL | Status: AC
  Administered 2012-10-28: 650 mg via RECTAL

## 2012-10-28 MED ORDER — FENTANYL CITRATE 0.05 MG/ML IJ SOLN
INTRAMUSCULAR | Status: DC | PRN
  Administered 2012-10-28: 150 ug via INTRAVENOUS
  Administered 2012-10-28 (×4): 100 ug via INTRAVENOUS
  Administered 2012-10-28: 250 ug via INTRAVENOUS
  Administered 2012-10-28: 200 ug via INTRAVENOUS
  Administered 2012-10-28 (×2): 50 ug via INTRAVENOUS
  Administered 2012-10-28 (×4): 100 ug via INTRAVENOUS

## 2012-10-28 SURGICAL SUPPLY — 126 items
ADAPTER CARDIO PERF ANTE/RETRO (ADAPTER) ×3 IMPLANT
ADH SKN CLS APL DERMABOND .7 (GAUZE/BANDAGES/DRESSINGS) ×4
ADPR PRFSN 84XANTGRD RTRGD (ADAPTER) ×2
APL SKNCLS STERI-STRIP NONHPOA (GAUZE/BANDAGES/DRESSINGS) ×4
BAG DECANTER FOR FLEXI CONT (MISCELLANEOUS) ×4 IMPLANT
BATTERY PACK STR FOR DRIVER (MISCELLANEOUS) ×2 IMPLANT
BENZOIN TINCTURE PRP APPL 2/3 (GAUZE/BANDAGES/DRESSINGS) ×4 IMPLANT
BLADE SURG 11 STRL SS (BLADE) ×4 IMPLANT
CANISTER SUCTION 2500CC (MISCELLANEOUS) ×6 IMPLANT
CANNULA FEM VENOUS REMOTE 22FR (CANNULA) ×1 IMPLANT
CANNULA GUNDRY RCSP 15FR (MISCELLANEOUS) ×3 IMPLANT
CANNULA OPTISITE PERFUSION 16F (CANNULA) IMPLANT
CANNULA OPTISITE PERFUSION 18F (CANNULA) ×1 IMPLANT
CANNULA VRC MALB SNGL STG 24FR (MISCELLANEOUS) IMPLANT
CATH ENDO VENT PULMONARY (CATHETERS) ×1 IMPLANT
CATH ENDOVENT PULMONARY (CATHETERS) ×3 IMPLANT
CATH HEART VENT LEFT (CATHETERS) IMPLANT
CATH ROBINSON RED A/P 18FR (CATHETERS) ×2 IMPLANT
CLOTH BEACON ORANGE TIMEOUT ST (SAFETY) ×3 IMPLANT
CONN ST 1/4X3/8  BEN (MISCELLANEOUS) ×2
CONN ST 1/4X3/8 BEN (MISCELLANEOUS) ×4 IMPLANT
CONNECTOR 1/2X3/8X1/2 3 WAY (MISCELLANEOUS) ×1
CONNECTOR 1/2X3/8X1/2 3WAY (MISCELLANEOUS) IMPLANT
CONT SPEC 4OZ CLIKSEAL STRL BL (MISCELLANEOUS) ×1 IMPLANT
CONT SPEC STER OR (MISCELLANEOUS) ×3 IMPLANT
COVER MAYO STAND STRL (DRAPES) ×3 IMPLANT
COVER PROBE W GEL 5X96 (DRAPES) ×1 IMPLANT
COVER SURGICAL LIGHT HANDLE (MISCELLANEOUS) ×5 IMPLANT
CRADLE DONUT ADULT HEAD (MISCELLANEOUS) ×3 IMPLANT
DERMABOND ADVANCED (GAUZE/BANDAGES/DRESSINGS) ×2
DERMABOND ADVANCED .7 DNX12 (GAUZE/BANDAGES/DRESSINGS) ×4 IMPLANT
DEVICE SUT CK QUICK LOAD INDV (Prosthesis & Implant Heart) ×2 IMPLANT
DEVICE SUT CK QUICK LOAD MINI (Prosthesis & Implant Heart) ×3 IMPLANT
DEVICE TROCAR PUNCTURE CLOSURE (ENDOMECHANICALS) ×3 IMPLANT
DRAIN CHANNEL 28F RND 3/8 FF (WOUND CARE) ×6 IMPLANT
DRAPE BILATERAL SPLIT (DRAPES) ×3 IMPLANT
DRAPE C-ARM 42X72 X-RAY (DRAPES) ×3 IMPLANT
DRAPE CV SPLIT W-CLR ANES SCRN (DRAPES) ×3 IMPLANT
DRAPE INCISE IOBAN 66X45 STRL (DRAPES) ×3 IMPLANT
DRAPE SLUSH/WARMER DISC (DRAPES) ×1 IMPLANT
DRILL BIT 2.2MM W/12M STOP (BIT) ×1 IMPLANT
DRSG COVADERM 4X8 (GAUZE/BANDAGES/DRESSINGS) ×3 IMPLANT
ELECT BLADE 6.5 EXT (BLADE) ×3 IMPLANT
ELECT REM PT RETURN 9FT ADLT (ELECTROSURGICAL) ×6
ELECTRODE REM PT RTRN 9FT ADLT (ELECTROSURGICAL) ×4 IMPLANT
FEMORAL VENOUS CANN RAP (CANNULA) IMPLANT
GLOVE BIO SURGEON STRL SZ 6 (GLOVE) ×2 IMPLANT
GLOVE BIO SURGEON STRL SZ 6.5 (GLOVE) ×5 IMPLANT
GLOVE BIO SURGEON STRL SZ7 (GLOVE) ×2 IMPLANT
GLOVE BIO SURGEON STRL SZ8 (GLOVE) ×1 IMPLANT
GLOVE BIOGEL PI IND STRL 6 (GLOVE) IMPLANT
GLOVE BIOGEL PI IND STRL 7.0 (GLOVE) IMPLANT
GLOVE BIOGEL PI INDICATOR 6 (GLOVE) ×1
GLOVE BIOGEL PI INDICATOR 7.0 (GLOVE) ×2
GLOVE ORTHO TXT STRL SZ7.5 (GLOVE) ×9 IMPLANT
GOWN STRL NON-REIN LRG LVL3 (GOWN DISPOSABLE) ×14 IMPLANT
INSERT CONFORM CROSS CLAMP 66M (MISCELLANEOUS) IMPLANT
INSERT CONFORM CROSS CLAMP 86M (MISCELLANEOUS) IMPLANT
KIT BASIN OR (CUSTOM PROCEDURE TRAY) ×3 IMPLANT
KIT DEVICE SUT COR-KNOT MIS 5 (INSTRUMENTS) ×1 IMPLANT
KIT DILATOR VASC 18G NDL (KITS) ×3 IMPLANT
KIT DRAINAGE VACCUM ASSIST (KITS) ×1 IMPLANT
KIT ROOM TURNOVER OR (KITS) ×3 IMPLANT
KIT SUCTION CATH 14FR (SUCTIONS) ×4 IMPLANT
LEAD PACING MYOCARDI (MISCELLANEOUS) ×3 IMPLANT
LINE VENT (MISCELLANEOUS) ×1 IMPLANT
NDL AORTIC ROOT 14G 7F (CATHETERS) ×2 IMPLANT
NEEDLE AORTIC ROOT 14G 7F (CATHETERS) ×3 IMPLANT
NS IRRIG 1000ML POUR BTL (IV SOLUTION) ×16 IMPLANT
PACK OPEN HEART (CUSTOM PROCEDURE TRAY) ×3 IMPLANT
PAD ARMBOARD 7.5X6 YLW CONV (MISCELLANEOUS) ×6 IMPLANT
PAD ELECT DEFIB RADIOL ZOLL (MISCELLANEOUS) ×3 IMPLANT
PATCH CORMATRIX 4CMX7CM (Prosthesis & Implant Heart) ×1 IMPLANT
PLATE UNIVERSAL 8 HOLE (Plate) ×1 IMPLANT
RETRACTOR TRL SOFT TISSUE LG (INSTRUMENTS) IMPLANT
RETRACTOR TRM SOFT TISSUE 7.5 (INSTRUMENTS) ×2 IMPLANT
RUBBERBAND STERILE (MISCELLANEOUS) IMPLANT
SCREW SELF TAP MAT 2.9X14MM (Screw) ×5 IMPLANT
SET CANNULATION TOURNIQUET (MISCELLANEOUS) ×3 IMPLANT
SET CARDIOPLEGIA MPS 5001102 (MISCELLANEOUS) ×1 IMPLANT
SET IRRIG TUBING LAPAROSCOPIC (IRRIGATION / IRRIGATOR) ×3 IMPLANT
SOLUTION ANTI FOG 6CC (MISCELLANEOUS) ×3 IMPLANT
SPONGE GAUZE 4X4 12PLY (GAUZE/BANDAGES/DRESSINGS) ×3 IMPLANT
STRIP CLOSURE SKIN 1/2X4 (GAUZE/BANDAGES/DRESSINGS) ×3 IMPLANT
SUT BONE WAX W31G (SUTURE) ×3 IMPLANT
SUT E-PACK MINIMALLY INVASIVE (SUTURE) ×3 IMPLANT
SUT ETHIBON 2 0 V 52N 30 (SUTURE) ×3 IMPLANT
SUT ETHIBOND X763 2 0 SH 1 (SUTURE) ×4 IMPLANT
SUT GORETEX CV 4 TH 22 36 (SUTURE) ×1 IMPLANT
SUT GORETEX CV4 TH-18 (SUTURE) ×8 IMPLANT
SUT MNCRL AB 3-0 PS2 18 (SUTURE) ×6 IMPLANT
SUT PROLENE 3 0 SH1 36 (SUTURE) ×12 IMPLANT
SUT PROLENE 4 0 RB 1 (SUTURE) ×48
SUT PROLENE 4-0 RB1 .5 CRCL 36 (SUTURE) ×8 IMPLANT
SUT PROLENE 5 0 C 1 36 (SUTURE) ×6 IMPLANT
SUT PROLENE 6 0 C 1 30 (SUTURE) ×8 IMPLANT
SUT SILK  1 MH (SUTURE)
SUT SILK 1 MH (SUTURE) IMPLANT
SUT SILK 1 TIES 10X30 (SUTURE) ×3 IMPLANT
SUT SILK 2 0 SH CR/8 (SUTURE) ×4 IMPLANT
SUT SILK 2 0 TIES 10X30 (SUTURE) ×3 IMPLANT
SUT SILK 2 0SH CR/8 30 (SUTURE) ×3 IMPLANT
SUT SILK 3 0 (SUTURE) ×3
SUT SILK 3 0 SH CR/8 (SUTURE) ×3 IMPLANT
SUT SILK 3 0SH CR/8 30 (SUTURE) ×3 IMPLANT
SUT SILK 3-0 18XBRD TIE 12 (SUTURE) ×2 IMPLANT
SUT TEM PAC WIRE 2 0 SH (SUTURE) ×6 IMPLANT
SUT VIC AB 2-0 CTX 36 (SUTURE) ×6 IMPLANT
SUT VIC AB 2-0 UR6 27 (SUTURE) ×6 IMPLANT
SUT VIC AB 3-0 SH 8-18 (SUTURE) ×13 IMPLANT
SUT VICRYL 2 TP 1 (SUTURE) IMPLANT
SYRINGE 10CC LL (SYRINGE) ×3 IMPLANT
SYSTEM SAHARA CHEST DRAIN ATS (WOUND CARE) ×3 IMPLANT
TAPE CLOTH SURG 4X10 WHT LF (GAUZE/BANDAGES/DRESSINGS) ×1 IMPLANT
TOWEL OR 17X24 6PK STRL BLUE (TOWEL DISPOSABLE) ×3 IMPLANT
TOWEL OR 17X26 10 PK STRL BLUE (TOWEL DISPOSABLE) ×3 IMPLANT
TRAY FOLEY IC TEMP SENS 14FR (CATHETERS) ×3 IMPLANT
TROCAR XCEL BLADELESS 5X75MML (TROCAR) IMPLANT
TROCAR XCEL NON-BLD 11X100MML (ENDOMECHANICALS) ×6 IMPLANT
TUBE FEEDING 8FR 16IN STR KANG (MISCELLANEOUS) ×1 IMPLANT
TUBE SUCT INTRACARD DLP 20F (MISCELLANEOUS) ×3 IMPLANT
UNDERPAD 30X30 INCONTINENT (UNDERPADS AND DIAPERS) ×3 IMPLANT
VALVE MAGNA EASE AORTIC 23MM (Prosthesis & Implant Heart) ×1 IMPLANT
VENT LEFT HEART 12002 (CATHETERS) ×3
VRC MALLEABLE SINGLE STG 24FR (MISCELLANEOUS) ×3
WATER STERILE IRR 1000ML POUR (IV SOLUTION) ×6 IMPLANT

## 2012-10-28 NOTE — Preoperative (Signed)
Beta Blockers   Reason not to administer Beta Blockers:Not Applicable 

## 2012-10-28 NOTE — Anesthesia Preprocedure Evaluation (Signed)
Anesthesia Evaluation  Patient identified by MRN, date of birth, ID band Patient awake    Reviewed: Allergy & Precautions, H&P , NPO status   Airway Mallampati: II  Neck ROM: Full    Dental  (+) Caps   Pulmonary shortness of breath,  breath sounds clear to auscultation        Cardiovascular + Valvular Problems/Murmurs AS Rhythm:Regular Rate:Normal     Neuro/Psych    GI/Hepatic negative GI ROS, Neg liver ROS,   Endo/Other  negative endocrine ROS  Renal/GU negative Renal ROS     Musculoskeletal   Abdominal (+) + obese,   Peds  Hematology negative hematology ROS (+)   Anesthesia Other Findings   Reproductive/Obstetrics                           Anesthesia Physical Anesthesia Plan  ASA: III  Anesthesia Plan: General   Post-op Pain Management:    Induction: Intravenous  Airway Management Planned: Oral ETT  Additional Equipment: PA Cath, 3D TEE and Ultrasound Guidance Line Placement  Intra-op Plan:   Post-operative Plan: Post-operative intubation/ventilation  Informed Consent: I have reviewed the patients History and Physical, chart, labs and discussed the procedure including the risks, benefits and alternatives for the proposed anesthesia with the patient or authorized representative who has indicated his/her understanding and acceptance.   Dental advisory given  Plan Discussed with: CRNA and Surgeon  Anesthesia Plan Comments:         Anesthesia Quick Evaluation

## 2012-10-28 NOTE — Progress Notes (Signed)
PM ROUNDS  S/p AVR  BP 122/83  Pulse 61  Temp(Src) 97.6 F (36.4 C) (Oral)  Resp 20  Ht 6' 0.05" (1.83 m)  Wt 210 lb 1.6 oz (95.3 kg)  BMI 28.46 kg/m2  SpO2 97% PA 30/15, CO= 6.27   Intake/Output Summary (Last 24 hours) at 10/28/12 1754 Last data filed at 10/28/12 1607  Gross per 24 hour  Intake    792 ml  Output   3250 ml  Net  -2458 ml   K= 3.8, HCt 35  Stable early postop

## 2012-10-28 NOTE — Brief Op Note (Signed)
10/28/2012  4:29 PM  PATIENT:  Bruce [name]  42 y.o. male  PRE-OPERATIVE DIAGNOSIS:  Aortic Stenosis  POST-OPERATIVE DIAGNOSIS:  Aortic Stenosis  PROCEDURE:  Procedure(s): MINIMALLY INVASIVE AORTIC VALVE REPLACEMENT (AVR) (N/A) INTRAOPERATIVE TRANSESOPHAGEAL ECHOCARDIOGRAM (N/A)  SURGEON:    Purcell Nails, MD  ASSISTANTS:  Coral Ceo, PA-C  ANESTHESIA:   Josepha Pigg, MD  CROSSCLAMP TIME:   151'  CARDIOPULMONARY BYPASS TIME: 232'  FINDINGS:  Bicuspid native aortic valve with severe aortic stenosis and mild-moderate aortic insufficiency  Mild LV systolic dysfunction (EF 40-45%)  COMPLICATIONS: none  PATIENT DISPOSITION:   TO SICU IN STABLE CONDITION  [name] 10/28/2012 4:29 PM

## 2012-10-28 NOTE — Anesthesia Procedure Notes (Signed)
Procedures PA catheter:  Routine monitors. Timeout, sterile prep, drape, FBP L neck.  Trendelenburg position.  1% Lido local, finder and trocar LIJ 1st pass with US guidance.  Cordis placed over J wire. PA catheter in easily.  Sterile dressing applied.  Patient tolerated well, VSS.  Sandford Craze, MD  [phone]

## 2012-10-28 NOTE — Interval H&P Note (Signed)
History and Physical Interval Note:  10/28/2012 7:45 AM  [name]  has presented today for surgery, with the diagnosis of AS  The various methods of treatment have been discussed with the patient and family. After consideration of risks, benefits and other options for treatment, the patient has consented to  Procedure(s): MINIMALLY INVASIVE AORTIC VALVE REPLACEMENT (AVR) (N/A) INTRAOPERATIVE TRANSESOPHAGEAL ECHOCARDIOGRAM (N/A) as a surgical intervention .  The patient's history has been reviewed, patient examined, no change in status, stable for surgery.  I have reviewed the patient's chart and labs.  Questions were answered to the patient's satisfaction.     OWEN,CLARENCE H

## 2012-10-28 NOTE — Progress Notes (Signed)
RT Note-Increased fio2 and VT per MD and ABG results. Albuterol treatments started due to high PIPs and BBS with wheezing.

## 2012-10-28 NOTE — Transfer of Care (Signed)
2Immediate Anesthesia Transfer of Care Note  Patient: Bruce [name]  Procedure(s) Performed: Procedure(s): MINIMALLY INVASIVE AORTIC VALVE REPLACEMENT (AVR) (N/A) INTRAOPERATIVE TRANSESOPHAGEAL ECHOCARDIOGRAM (N/A)  Patient Location: PACU and SICU  Anesthesia Type:General  Level of Consciousness: sedated, unresponsive and Patient remains intubated per anesthesia plan  Airway & Oxygen Therapy: Patient remains intubated per anesthesia plan and Patient placed on Ventilator (see vital sign flow sheet for setting)  Post-op Assessment: Post -op Vital signs reviewed and stable  Post vital signs: Reviewed and stable  Complications: No apparent anesthesia complications

## 2012-10-28 NOTE — OR Nursing (Signed)
15:25 - call to vol. Desk to inform family off pump per Dr. Cornelius Moras.  16:10  - 1st call to SICU,  16:35 - 2nd call to SICU.

## 2012-10-28 NOTE — Op Note (Signed)
CARDIOTHORACIC SURGERY OPERATIVE NOTE  Date of Procedure:   10/28/2012  Preoperative Diagnosis:  Severe Aortic Stenosis   Postoperative Diagnosis:  Same   Procedure:    Minimally Invasive Aortic Valve Replacement  Edwards Magna Ease Pericardial Tissue Valve (size 23mm, catalog #[id number], serial [id number])   Surgeon:  Salvatore Decent. Cornelius Moras, MD  Assistant:  Coral Ceo, PA-C  Anesthesia:  Josepha Pigg, MD  Operative Findings: Bicuspid native aortic valve with severe aortic stenosis and mild-moderate aortic insufficiency  Mild LV systolic dysfunction (EF 40-45%)        BRIEF CLINICAL NOTE AND INDICATIONS FOR SURGERY  Patient is a 42 year old single white male referred by Dr. Jacinto Halim for possible surgical treatment of bicuspid aortic valve with aortic stenosis and aortic regurgitation. The patient states that he was first noted to have a heart murmur on physical exam while he was in the [employer] in 1989. 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 He currently works as an Airline pilot and lives in [place], [place], although he also has a second home locally in [place]. He has been followed for several years by Dr. Felipa Eth who has been checking routine follow up echocardiograms every 2-3 years. A followup transthoracic echocardiogram was performed that suggested the patient's aortic stenosis had progressed to the point of becoming severe with peak velocity across the valve approaching 4 m/s corresponding to peak and mean transvalvular gradients estimated to be 61 and 40 mm mercury respectively. Left ventricular ejection fraction was estimated 40%. The patient was referred to Dr. Jacinto Halim who performed both TEE and left and right heart catheterization. TEE confirmed the presence of what appeared to be a bicuspid aortic valve with at least moderate aortic stenosis and moderate aortic regurgitation. There was at least moderate global  left ventricular systolic dysfunction with ejection fraction estimated 25-30%. Left and right heart catheterization was notable for the absence of significant coronary artery disease. There was moderate aortic stenosis with mean gradient across the aortic valve by cath measured 25.5 mm mercury. There was moderate aortic insufficiency and the thoracic aorta appeared relatively normal sized. Pulmonary artery pressures were normal. The patient was referred for elective surgical consultation.  The patient has been seen in consultation and counseled at length regarding the indications, risks and potential benefits of surgery.  All questions have been answered, and the patient provides full informed consent for the operation as described.     DETAILS OF THE OPERATIVE PROCEDURE  Preparation:  The patient is brought to the operating room on the above mentioned date and central monitoring was established by the anesthesia team including placement of Swan-Ganz catheter through the left internal jugular vein.  A radial arterial line is placed. The patient is placed in the supine position on the operating table.  Intravenous antibiotics are administered. General endotracheal anesthesia is induced uneventfully. The patient is initially intubated using a dual lumen endotracheal tube.  A Foley catheter is placed.  Baseline transesophageal echocardiogram was performed.  Findings were notable for a bicuspid native aortic valve which was heavily calcified with severe leaflet restriction.  There was severe aortic stenosis and mild to moderate aortic insufficiency.  There was mild to moderate global LV systolic dysfunction with ejection fraction estimated 40-45%.  The patient is placed in the supine position with their neck gently extended and turned to the left.   The patient's right neck, chest, abdomen, both groins, and both lower extremities are prepared and  draped in a sterile manner. A time out procedure is  performed.   Surgical Approach:  A right miniature anteriorthoracotomy incision is performed. The incision is placed immediately over the third costal cartilage.  The muscle fibers of the pectoralis major muscle are split longitudinally and the right pleural space is entered through the 3rd intercostal space. The second costal cartilage is divided at it's insertion onto the sternum.  The right internal mammary artery and veins are ligated and divided.  A soft tissue retractor is placed.  Two 11 mm ports are placed through separate stab incisions inferiorly. The right pleural space is insufflated continuously with carbon dioxide gas through the posterior port during the remainder of the operation.     Extracorporeal Cardiopulmonary Bypass and Myocardial Protection:  A small incision is made in the right inguinal crease and the anterior surface of the right common femoral artery and right common femoral vein are identified.  The patient is placed in Trendelenburg position. The right internal jugular vein is cannulated with Seldinger technique and a guidewire advanced into the right atrium. An Edwards Endovent introducing sheath is placed into the right internal jugular vein, and the Endovent catheter passed through the sheath and floated into the main pulmonary artery.  Pursestring sutures are placed on the anterior surface of the right common femoral vein and right common femoral artery. The right common femoral vein is cannulated with the Seldinger technique and a guidewire is advanced under transesophageal echocardiogram guidance through the right atrium. The femoral vein is cannulated with a long 22 French femoral venous cannula. The right common femoral artery is cannulated with Seldinger technique and a flexible guidewire is advanced until it can be appreciated intraluminally in the descending thoracic aorta on transesophageal echocardiogram. The femoral artery is cannulated with an 18 French femoral  arterial cannula.  Adequate heparinization is verified.     The entire pre-bypass portion of the operation was notable for stable hemodynamics.  Cardiopulmonary bypass was begun.  Vacuum assist venous drainage is utilized. An incision is made in the pericardium over the ascending aorta and extended in both directions. Silk traction sutures are placed to retract the pericardium.  Venous drainage is incomplete, so a second 6 French pediatric femoral venous cannula is placed directly into the right atrium.  A retrograde cardioplegia cannula is placed through the right atrium into the coronary sinus using transesophageal echocardiogram guidance.  An antegrade cardioplegia cannula is placed in the ascending aorta.  A left ventricular vent is placed through the right superior pulmonary vein.  The patient is cooled to 28C systemic temperature.  The aortic cross clamp is applied and cold blood cardioplegia is delivered initially in an antegrade fashion through the aortic root.  Supplemental cardioplegia is given retrograde through the coronary sinus catheter. The initial cardioplegic arrest is rapid with early diastolic arrest.  Repeat doses of cardioplegia are administered intermittently every 20 to 30 minutes throughout the entire cross clamp portion of the operation through the coronary sinus catheter in order to maintain completely flat electrocardiogram.  Myocardial protection was felt to be excellent.   Aortic Valve Replacement:  An oblique transverse aortotomy incision was performed.  The aortic valve was inspected and notable for congenitally bicuspid valve with 2 commissures 180 degrees opposed to each other.  There was severe calcification of the valve and annulus and severe aortic stenosis.  The aortic valve leaflets were excised sharply and the aortic annulus decalcified.  Decalcification was notably tedious due to the thickness  and extensiveness of calcification.  The aortic annulus was sized to  accept a 23 mm prosthesis.  The aortic root and left ventricle were irrigated with copious cold saline solution.  Aortic valve replacement was performed using interrupted horizontal mattress 2-0 Ethibond pledgeted sutures with pledgets in the subannular position.  An [facility] Ease pericardial tissue valve (size 23 mm, model # 3300TFX, serial # [id number]) was implanted uneventfully.  Cor-knot fixation was utilized.  The valve seated appropriately with adequate distance below the left main and right coronary arteries.   Procedure Completion:  The aortotomy was closed using a 2-layer closure of interrupted 4-0 Prolene suture with teflon felt pledgets.  One final dose of warm retrograde "hot shot" cardioplegia was administered retrograde through the coronary sinus catheter while all air was evacuated through the aortic root.  The aortic cross clamp was removed after a total cross clamp time of 151 minutes.  Epicardial pacing wires are fixed to the right ventricular outflow tract and to the right atrial appendage. The patient is rewarmed to 37C temperature.  The pericardial sac was drained using a 28 French Bard drain placed through the anterior port incision.   The patient is weaned and disconnected from cardiopulmonary bypass.  The patient's rhythm at separation from bypass was sinus.  The patient was weaned from bypass without any inotropic support. Total cardiopulmonary bypass time for the operation was 232 minutes.  Followup transesophageal echocardiogram performed after separation from bypass revealed a well-seated bioprosthetic tissue valve in the aortic position with no perivalvular leak.  Left ventricular function was unchanged from preoperatively.    The femoral arterial and venous cannulae were removed uneventfully. There was a palpable pulse in the distal right common femoral artery after removal of the cannula.  The Endovent catheter was removed.   Protamine was administered to reverse the  anticoagulation.   Single lung ventilation was begun. The aortotomy closure was inspected for hemostasis.  The pericardium was closed using a patch of core matrix bovine submucosal tissue patch. The right pleural space is irrigated with saline solution and inspected for hemostasis. The right pleural space was drained using a 28 French Bard drain placed through the posterior port incision. The miniature thoracotomy incision was closed in multiple layers in routine fashion. The right groin incision was inspected for hemostasis and closed in multiple layers in routine fashion.   The post-bypass portion of the operation was notable for stable rhythm and hemodynamics.   No blood products were administered during the operation.   Disposition:  The patient tolerated the procedure well.  The patient was reintubated using a single lumen endotracheal tube and subsequently transported to the surgical intensive care unit in stable condition. There were no intraoperative complications. All sponge instrument and needle counts are verified correct at completion of the operation.     Salvatore Decent. Cornelius Moras MD 10/28/2012 4:30 PM

## 2012-10-29 ENCOUNTER — Inpatient Hospital Stay (HOSPITAL_COMMUNITY): Payer: BC Managed Care – PPO

## 2012-10-29 LAB — CBC
HCT: 33.6 % — ABNORMAL LOW (ref 39.0–52.0)
MCH: 29.9 pg (ref 26.0–34.0)
MCHC: 34.2 g/dL (ref 30.0–36.0)
MCV: 86.3 fL (ref 78.0–100.0)
MCV: 87.5 fL (ref 78.0–100.0)
Platelets: 121 10*3/uL — ABNORMAL LOW (ref 150–400)
RDW: 13.1 % (ref 11.5–15.5)
RDW: 13.6 % (ref 11.5–15.5)
WBC: 17.2 10*3/uL — ABNORMAL HIGH (ref 4.0–10.5)

## 2012-10-29 LAB — POCT I-STAT 3, ART BLOOD GAS (G3+)
Bicarbonate: 19.6 mEq/L — ABNORMAL LOW (ref 20.0–24.0)
O2 Saturation: 96 %
TCO2: 22 mmol/L (ref 0–100)
pCO2 arterial: 39.1 mmHg (ref 35.0–45.0)
pH, Arterial: 7.36 (ref 7.350–7.450)
pH, Arterial: 7.337 — ABNORMAL LOW (ref 7.350–7.450)
pO2, Arterial: 86 mmHg (ref 80.0–100.0)
pO2, Arterial: 92 mmHg (ref 80.0–100.0)

## 2012-10-29 LAB — BASIC METABOLIC PANEL
Calcium: 8 mg/dL — ABNORMAL LOW (ref 8.4–10.5)
Chloride: 108 mEq/L (ref 96–112)
Creatinine, Ser: 0.8 mg/dL (ref 0.50–1.35)
GFR calc Af Amer: >90 mL/min (ref >90)
GFR calc non Af Amer: >90 mL/min (ref >90)

## 2012-10-29 LAB — GLUCOSE, CAPILLARY
Glucose-Capillary: 105 mg/dL — ABNORMAL HIGH (ref 70–99)
Glucose-Capillary: 100 mg/dL — ABNORMAL HIGH (ref 70–99)
Glucose-Capillary: 112 mg/dL — ABNORMAL HIGH (ref 70–99)

## 2012-10-29 LAB — MAGNESIUM: Magnesium: 2.2 mg/dL (ref 1.5–2.5)

## 2012-10-29 MED ORDER — DIPHENHYDRAMINE HCL 12.5 MG/5ML PO ELIX
12.5000 mg | ORAL_SOLUTION | Freq: Four times a day (QID) | ORAL | Status: DC | PRN
  Filled 2012-10-29: qty 5

## 2012-10-29 MED ORDER — SODIUM CHLORIDE 0.9 % IJ SOLN: 9.0000 mL | INTRAMUSCULAR | Status: DC | PRN

## 2012-10-29 MED ORDER — SODIUM CHLORIDE 0.9 % IV SOLN: 250.0000 mL | INTRAVENOUS | Status: DC | PRN

## 2012-10-29 MED ORDER — POTASSIUM CHLORIDE CRYS ER 20 MEQ PO TBCR
20.0000 meq | EXTENDED_RELEASE_TABLET | Freq: Every day | ORAL | Status: DC
  Administered 2012-10-29: 20 meq via ORAL
  Filled 2012-10-29 (×2): qty 1

## 2012-10-29 MED ORDER — FUROSEMIDE 40 MG PO TABS
40.0000 mg | ORAL_TABLET | Freq: Every day | ORAL | Status: DC
  Filled 2012-10-29: qty 1

## 2012-10-29 MED ORDER — FUROSEMIDE 10 MG/ML IJ SOLN
20.0000 mg | Freq: Four times a day (QID) | INTRAMUSCULAR | Status: AC
  Administered 2012-10-29 (×3): 20 mg via INTRAVENOUS
  Filled 2012-10-29 (×2): qty 2

## 2012-10-29 MED ORDER — MORPHINE SULFATE 2 MG/ML IJ SOLN
2.0000 mg | INTRAMUSCULAR | Status: DC | PRN
  Administered 2012-10-29 (×2): 2 mg via INTRAVENOUS
  Filled 2012-10-29 (×2): qty 2
  Filled 2012-10-29: qty 1

## 2012-10-29 MED ORDER — ALBUTEROL SULFATE (5 MG/ML) 0.5% IN NEBU: 2.5000 mg | INHALATION_SOLUTION | Freq: Four times a day (QID) | RESPIRATORY_TRACT | Status: DC | PRN

## 2012-10-29 MED ORDER — ONDANSETRON HCL 4 MG/2ML IJ SOLN: 4.0000 mg | Freq: Four times a day (QID) | INTRAMUSCULAR | Status: DC | PRN

## 2012-10-29 MED ORDER — ALBUTEROL SULFATE (5 MG/ML) 0.5% IN NEBU
2.5000 mg | INHALATION_SOLUTION | Freq: Three times a day (TID) | RESPIRATORY_TRACT | Status: DC
  Administered 2012-10-30: 2.5 mg via RESPIRATORY_TRACT
  Filled 2012-10-29 (×4): qty 0.5

## 2012-10-29 MED ORDER — NALOXONE HCL 0.4 MG/ML IJ SOLN: 0.4000 mg | INTRAMUSCULAR | Status: DC | PRN

## 2012-10-29 MED ORDER — SODIUM CHLORIDE 0.9 % IJ SOLN: 3.0000 mL | INTRAMUSCULAR | Status: DC | PRN

## 2012-10-29 MED ORDER — SODIUM CHLORIDE 0.9 % IV SOLN
250.0000 mL | INTRAVENOUS | Status: DC
  Administered 2012-10-29: 250 mL via INTRAVENOUS

## 2012-10-29 MED ORDER — ALPRAZOLAM 0.25 MG PO TABS
0.2500 mg | ORAL_TABLET | Freq: Three times a day (TID) | ORAL | Status: DC | PRN
  Administered 2012-10-29 – 2012-10-31 (×4): 0.25 mg via ORAL
  Filled 2012-10-29 (×4): qty 1

## 2012-10-29 MED ORDER — MAGNESIUM HYDROXIDE 400 MG/5ML PO SUSP: 30.0000 mL | Freq: Every day | ORAL | Status: DC | PRN

## 2012-10-29 MED ORDER — MOVING RIGHT ALONG BOOK
Freq: Once | Status: AC
  Administered 2012-10-29: 11:00:00
  Filled 2012-10-29: qty 1

## 2012-10-29 MED ORDER — POTASSIUM CHLORIDE 10 MEQ/50ML IV SOLN
10.0000 meq | INTRAVENOUS | Status: AC | PRN
  Administered 2012-10-29 (×3): 10 meq via INTRAVENOUS
  Filled 2012-10-29: qty 150

## 2012-10-29 MED ORDER — FUROSEMIDE 10 MG/ML IJ SOLN
INTRAMUSCULAR | Status: AC
  Filled 2012-10-29: qty 4

## 2012-10-29 MED ORDER — METOPROLOL TARTRATE 25 MG PO TABS
25.0000 mg | ORAL_TABLET | Freq: Two times a day (BID) | ORAL | Status: DC
  Administered 2012-10-29 – 2012-11-01 (×6): 25 mg via ORAL
  Filled 2012-10-29 (×8): qty 1

## 2012-10-29 MED ORDER — POTASSIUM CHLORIDE 10 MEQ/50ML IV SOLN
10.0000 meq | INTRAVENOUS | Status: AC
  Administered 2012-10-29 (×3): 10 meq via INTRAVENOUS

## 2012-10-29 MED ORDER — POTASSIUM CHLORIDE CRYS ER 20 MEQ PO TBCR: 20.0000 meq | EXTENDED_RELEASE_TABLET | Freq: Two times a day (BID) | ORAL | Status: DC

## 2012-10-29 MED ORDER — DIPHENHYDRAMINE HCL 50 MG/ML IJ SOLN: 12.5000 mg | Freq: Four times a day (QID) | INTRAMUSCULAR | Status: DC | PRN

## 2012-10-29 MED ORDER — MORPHINE SULFATE (PF) 1 MG/ML IV SOLN
INTRAVENOUS | Status: DC
  Administered 2012-10-29: 1 mg via INTRAVENOUS
  Administered 2012-10-29: 4.5 mg via INTRAVENOUS
  Administered 2012-10-30: 1.5 mg via INTRAVENOUS
  Administered 2012-10-30: 4.5 mg via INTRAVENOUS
  Administered 2012-10-30: 3 mg via INTRAVENOUS
  Filled 2012-10-29: qty 25

## 2012-10-29 MED ORDER — SODIUM CHLORIDE 0.9 % IJ SOLN
3.0000 mL | Freq: Two times a day (BID) | INTRAMUSCULAR | Status: DC
  Administered 2012-10-29 – 2012-10-31 (×5): 3 mL via INTRAVENOUS

## 2012-10-29 MED ORDER — ATORVASTATIN CALCIUM 80 MG PO TABS
80.0000 mg | ORAL_TABLET | Freq: Every day | ORAL | Status: DC
  Administered 2012-10-30 – 2012-11-01 (×3): 80 mg via ORAL
  Filled 2012-10-29 (×3): qty 1

## 2012-10-29 MED FILL — Potassium Chloride Inj 2 mEq/ML: INTRAVENOUS | Qty: 40 | Status: AC

## 2012-10-29 MED FILL — Sodium Chloride Irrigation Soln 0.9%: Qty: 3000 | Status: AC

## 2012-10-29 MED FILL — Heparin Sodium (Porcine) Inj 1000 Unit/ML: INTRAMUSCULAR | Qty: 20 | Status: AC

## 2012-10-29 MED FILL — Sodium Bicarbonate IV Soln 8.4%: INTRAVENOUS | Qty: 50 | Status: AC

## 2012-10-29 MED FILL — Lidocaine HCl IV Inj 20 MG/ML: INTRAVENOUS | Qty: 5 | Status: AC

## 2012-10-29 MED FILL — Magnesium Sulfate Inj 50%: INTRAMUSCULAR | Qty: 10 | Status: AC

## 2012-10-29 MED FILL — Sodium Chloride IV Soln 0.9%: INTRAVENOUS | Qty: 1000 | Status: AC

## 2012-10-29 MED FILL — Heparin Sodium (Porcine) Inj 1000 Unit/ML: INTRAMUSCULAR | Qty: 30 | Status: AC

## 2012-10-29 MED FILL — Mannitol IV Soln 20%: INTRAVENOUS | Qty: 500 | Status: AC

## 2012-10-29 MED FILL — Electrolyte-R (PH 7.4) Solution: INTRAVENOUS | Qty: 4000 | Status: AC

## 2012-10-29 NOTE — Progress Notes (Signed)
T[facility] BRIEF PROGRESS NOTE  1 Day Post-Op  S/P Procedure(s) (LRB): MINIMALLY INVASIVE AORTIC VALVE REPLACEMENT (AVR) (N/A) INTRAOPERATIVE TRANSESOPHAGEAL ECHOCARDIOGRAM (N/A)   Bruce [name] is doing quite well but still complaining of pain and soreness in chest.  Pain was relieved the best by Toradol.  Morphine helped but didn't last long.  He otherwise looks and feels well  Plan: Will start PCA   [name] H 10/29/2012 3:54 PM

## 2012-10-29 NOTE — Progress Notes (Signed)
Transferred the pt in a wheelchair.  Significant other accompanied on transport.  All VS WNL upon transport.  Right jugular line in place and will verify necessity with Dr. Cornelius Moras - nurse advised.  Report called to [place] and all questions and concerns addressed.

## 2012-10-29 NOTE — Procedures (Signed)
Extubation Procedure Note  Patient Details:   Name: Bruce [name] DOB: Apr 02, 1970 MRN: [id number]      Evaluation  O2 sats: stable throughout Complications: No apparent complications Patient did tolerate procedure well. Bilateral Breath Sounds: Clear  Pt able to speak: Yes   NIF -40, FVC 2.3. Pt able to breath around cuff. Pt extubated per protocol to 5L [place]. No stridor. Pt able to vocalize. Fredrich Birks 10/29/2012, 2:08 AM

## 2012-10-29 NOTE — Progress Notes (Signed)
Right IJ triple lumen removed per protocol. Manual pressure applied x63min using 4x4 vaseline gauze. Bedrest x1hr; patient verbalized understanding. Call bell near.Bruce [name]

## 2012-10-29 NOTE — Progress Notes (Signed)
      [address]       [name] [address]             [phone]        CARDIOTHORACIC SURGERY PROGRESS NOTE   R1 Day Post-Op Procedure(s) (LRB): MINIMALLY INVASIVE AORTIC VALVE REPLACEMENT (AVR) (N/A) INTRAOPERATIVE TRANSESOPHAGEAL ECHOCARDIOGRAM (N/A)  Subjective: Feels sore in chest.  Otherwise doing quite well.  Objective: Vital signs: BP Readings from Last 1 Encounters:  10/29/12 92/47   Pulse Readings from Last 1 Encounters:  10/29/12 89   Resp Readings from Last 1 Encounters:  10/29/12 18   Temp Readings from Last 1 Encounters:  10/29/12 99.7 F (37.6 C)     Hemodynamics: PAP: (20-35)/(8-24) 30/15 mmHg CO:  [5.1 L/min-8.7 L/min] 8.7 L/min CI:  [2.3 L/min/m2-4 L/min/m2] 4 L/min/m2  Physical Exam:  Rhythm:   sinus  Breath sounds: clear  Heart sounds:  RRR  Incisions:  Dressing dry, intact  Abdomen:  Soft, non-distended, non-tender  Extremities:  Warm, well-perfused    Intake/Output from previous day: 09/10 0701 - 09/11 0700 In: 2294 [I.V.:992; Blood:542; NG/GT:60; IV Piggyback:700] Out: 4860 [Urine:2685; Blood:1650; Chest Tube:525] Intake/Output this shift:    Lab Results:  CBC: Recent Labs  10/28/12 1730 10/29/12 0330  WBC 16.1* 17.2*  HGB 12.0* 12.2*  HCT 35.4* 34.7*  PLT 111* 121*    BMET:  Recent Labs  10/26/12 1430  10/28/12 1721 10/29/12 0330  NA 138  < > 143 137  K 4.2  < > 3.8 3.5  CL 105  --   --  108  CO2 20  --   --  20  GLUCOSE 99  < > 72 119*  BUN 12  --   --  12  CREATININE 0.79  --   --  0.80  CALCIUM 9.7  --   --  8.0*  < > = values in this interval not displayed.   CBG (last 3)   Recent Labs  10/28/12 2148 10/28/12 2314 10/29/12 0410  GLUCAP 94 96 105*    ABG    Component Value Date/Time   PHART 7.337* 10/29/2012 0307   PCO2ART 39.1 10/29/2012 0307   PO2ART 92.0 10/29/2012 0307   HCO3 20.7 10/29/2012 0307   TCO2 22 10/29/2012 0307   ACIDBASEDEF 4.0* 10/29/2012 0307   O2SAT 96.0  10/29/2012 0307    CXR: Looks good.  Mild bibasilar atelectasis R>L  Assessment/Plan: S/P Procedure(s) (LRB): MINIMALLY INVASIVE AORTIC VALVE REPLACEMENT (AVR) (N/A) INTRAOPERATIVE TRANSESOPHAGEAL ECHOCARDIOGRAM (N/A)  Doing well POD1 Maintaining NSR w/ stable hemodynamics on no drips Expected post op acute blood loss anemia, mild, stable Expected post op volume excess, mild Expected post op atelectasis, mild   Mobilize  Diuresis  Transfer step down   Darvis Croft H 10/29/2012 7:52 AM

## 2012-10-29 NOTE — Progress Notes (Signed)
Patient spiked a temp of 102.3 around 2300.  Administered scheduled Tylenol and encouraged use of IS.  Notified Dr. Cornelius Moras and received no new orders.  Re-checked temp at 0030 and now has gone down to 99.7.  Will continue to monitor.  Arva Chafe

## 2012-10-29 NOTE — Progress Notes (Signed)
Initial Morphine PCA set-up per order with 3mg  bolus. Initial set-up confirmed x2 RNs Albesa Seen and Lonia Blood, RN. O2 sat monitoring provided as well as EtCO2 monitoring. VSS. Call bell and family near. Will monitor.Mamie Levers

## 2012-10-29 NOTE — Progress Notes (Signed)
Patient received to 2w 25. Patient diaphoretic and c/o pain 6/10 on pain scale. Denies shortness of breath. VSS. Chest incision intact. Chest tube intact to water seal; no s/s of air leak with dressing intact. Right IJ Triple lumen noted with dressing undone and questionable position of line. Dressing reapplied. Spoke with Joni Reining, Rn regarding IJ. Stated awaiting call back from Dr. Cornelius Moras about possible d/c. Will continue to monitor. Will administer oxycodone per order. Family at bedside.Bruce [name]

## 2012-10-29 NOTE — Progress Notes (Signed)
Utilization review completed. Chanee Henrickson, RN, BSN. 

## 2012-10-30 ENCOUNTER — Encounter (HOSPITAL_COMMUNITY): Payer: Self-pay | Admitting: Thoracic Surgery (Cardiothoracic Vascular Surgery)

## 2012-10-30 ENCOUNTER — Inpatient Hospital Stay (HOSPITAL_COMMUNITY): Payer: BC Managed Care – PPO

## 2012-10-30 LAB — GLUCOSE, CAPILLARY

## 2012-10-30 LAB — CBC
HCT: 33.2 % — ABNORMAL LOW (ref 39.0–52.0)
MCHC: 33.7 g/dL (ref 30.0–36.0)
MCV: 87.6 fL (ref 78.0–100.0)
Platelets: 107 10*3/uL — ABNORMAL LOW (ref 150–400)
RDW: 13.5 % (ref 11.5–15.5)

## 2012-10-30 LAB — BASIC METABOLIC PANEL
BUN: 16 mg/dL (ref 6–23)
Calcium: 8.5 mg/dL (ref 8.4–10.5)
Creatinine, Ser: 1.09 mg/dL (ref 0.50–1.35)
GFR calc Af Amer: >90 mL/min (ref >90)
GFR calc non Af Amer: 82 mL/min — ABNORMAL LOW (ref >90)

## 2012-10-30 MED ORDER — POTASSIUM CHLORIDE CRYS ER 20 MEQ PO TBCR
20.0000 meq | EXTENDED_RELEASE_TABLET | Freq: Two times a day (BID) | ORAL | Status: DC
  Administered 2012-10-30 – 2012-11-01 (×5): 20 meq via ORAL
  Filled 2012-10-30 (×5): qty 1

## 2012-10-30 MED ORDER — FUROSEMIDE 40 MG PO TABS
40.0000 mg | ORAL_TABLET | Freq: Two times a day (BID) | ORAL | Status: DC
  Administered 2012-10-30 – 2012-11-01 (×5): 40 mg via ORAL
  Filled 2012-10-30 (×7): qty 1

## 2012-10-30 NOTE — Progress Notes (Addendum)
[address]       Bruce [name] [address]             [phone]      2 Days Post-Op  Procedure(s) (LRB): MINIMALLY INVASIVE AORTIC VALVE REPLACEMENT (AVR) (N/A) INTRAOPERATIVE TRANSESOPHAGEAL ECHOCARDIOGRAM (N/A) Subjective: Feels moderate discomfort, but overall fells pretty well. Fever overnight  Objective  Telemetry sinus rhythm  Temp:  [97.7 F (36.5 C)-102.3 F (39.1 C)] 99.4 F (37.4 C) (09/12 0448) Pulse Rate:  [87-100] 88 (09/12 0448) Resp:  [9-29] 22 (09/12 0448) BP: (95-111)/(49-70) 105/57 mmHg (09/12 0448) SpO2:  [80 %-100 %] 97 % (09/12 0448) Weight:  [213 lb 14.4 oz (97.024 kg)] 213 lb 14.4 oz (97.024 kg) (09/12 0448)   Intake/Output Summary (Last 24 hours) at 10/30/12 0728 Last data filed at 10/30/12 0534  Gross per 24 hour  Intake   2149 ml  Output   1495 ml  Net    654 ml       General appearance: alert, cooperative and no distress Heart: regular rate and rhythm and soft syst murmur Lungs: clear to auscultation bilaterally Abdomen: benign Extremities: min edema Wound: dressings CDI  Lab Results:  Recent Labs  10/29/12 0330 10/29/12 1626 10/30/12 0542  NA 137  --  137  K 3.5  --  3.7  CL 108  --  103  CO2 20  --  24  GLUCOSE 119*  --  117*  BUN 12  --  16  CREATININE 0.80 1.03 1.09  CALCIUM 8.0*  --  8.5  MG 2.2 2.2  --    No results found for this basename: AST, ALT, ALKPHOS, BILITOT, PROT, ALBUMIN,  in the last 72 hours No results found for this basename: LIPASE, AMYLASE,  in the last 72 hours  Recent Labs  10/29/12 1626 10/30/12 0542  WBC 17.9* 17.5*  HGB 11.5* 11.2*  HCT 33.6* 33.2*  MCV 87.5 87.6  PLT 111* 107*   No results found for this basename: CKTOTAL, CKMB, TROPONINI,  in the last 72 hours No components found with this basename: POCBNP,  No results found for this basename: DDIMER,  in the last 72 hours No results found for this basename: HGBA1C,  in the last 72 hours No results found for  this basename: CHOL, HDL, LDLCALC, TRIG, CHOLHDL,  in the last 72 hours No results found for this basename: TSH, T4TOTAL, FREET3, T3FREE, THYROIDAB,  in the last 72 hours No results found for this basename: VITAMINB12, FOLATE, FERRITIN, TIBC, IRON, RETICCTPCT,  in the last 72 hours  Medications: Scheduled . acetaminophen  1,000 mg Oral Q6H   Or  . acetaminophen (TYLENOL) oral liquid 160 mg/5 mL  1,000 mg Per Tube Q6H  . albuterol  2.5 mg Nebulization TID  . aspirin EC  325 mg Oral Daily  . atorvastatin  80 mg Oral Daily  . bisacodyl  10 mg Oral Daily   Or  . bisacodyl  10 mg Rectal Daily  . cefUROXime (ZINACEF)  IV  1.5 g Intravenous Q12H  . docusate sodium  200 mg Oral Daily  . furosemide  40 mg Oral Daily  . insulin aspart  0-24 Units Subcutaneous Q4H  . metoprolol tartrate  25 mg Oral BID  . morphine   Intravenous Q4H  . pantoprazole  40 mg Oral Daily  . potassium chloride  20 mEq Oral Daily  . sodium chloride  3 mL Intravenous Q12H  . sodium chloride  3 mL Intravenous Q12H     Radiology/Studies:  Dg Chest Portable 1 View In Am  10/29/2012   CLINICAL DATA:  Post minimally invasive AVR  EXAM: PORTABLE CHEST - 1 VIEW  COMPARISON:  Portable exam 0614 hr compared to 10/28/2012  FINDINGS: Post AVR.  Endotracheal and nasogastric tubes removed.  Left jugular Swan-Ganz catheter tip projects over the distal main pulmonary artery.  Right jugular central venous catheter tip projects over proximal SVC.  Epicardial pacing wires present.  Right thoracostomy tube and mediastinal drain unchanged.  Upper normal heart size.  No mediastinal contours and pulmonary vascularity.  Mild persistent atelectasis lower right lung.  No gross pleural effusion or pneumothorax.  IMPRESSION: Stable postoperative changes.   Electronically Signed   By: Ulyses Southward M.D.   On: 10/29/2012 08:20   Dg Chest Portable 1 View  10/28/2012   *RADIOLOGY REPORT*  Clinical Data: Postoperative aortic valve replacement  PORTABLE  CHEST - 1 VIEW  Comparison:  October 26, 2012  Findings: Endotracheal tube tip is 3.7 cm above the carina.  Right jugular catheter tip is in the supra vena cava.  Swan-Ganz catheter tip is in the distal right main pulmonary artery near the junction with the right intralobar pulmonary artery.  Nasogastric tube tip and side port in the stomach.  There is a chest tube on the right. No pneumothorax.  There is an aortic valve replacement.  There is a surgical fixation in the mid sternal region.  There is no edema or consolidation. Heart is upper normal in size with normal pulmonary vascularity.  IMPRESSION: Tube and catheter positions as described.  No pneumothorax.  No edema or consolidation.   Original Report Authenticated By: Bretta Bang, M.D.    INR: Will add last result for INR, ABG once components are confirmed Will add last 4 CBG results once components are confirmed  Assessment/Plan: S/P Procedure(s) (LRB): MINIMALLY INVASIVE AORTIC VALVE REPLACEMENT (AVR) (N/A) INTRAOPERATIVE TRANSESOPHAGEAL ECHOCARDIOGRAM (N/A)  1 doing well 2 fever prob rel to atx, push pulm toilet/rehab 3 in Sinus rhythm 4 sugars controlled, leave on SSI one more day 5 CT's 440cc yesterday- poss d/c 1 tube soon 6 gentle diuresis  7 labs stable- leukocytosis same 8 cont pca for now  LOS: 2 days    [name],[name] 9/12/20147:28 AM  I have seen and examined the patient and agree with the assessment and plan as outlined.  Possibly d/c chest tubes later today or tomorrow.  Will keep PCA one more day.  D/C CBG's and SSI - patient is not diabetic.  Supplement potassium.  Mobilize  Bruce [name] 10/30/2012 8:31 AM

## 2012-10-30 NOTE — Discharge Summary (Signed)
Physician Discharge Summary  Patient ID: Bruce [name] MRN: [id number] DOB/AGE: 05/25/1970 42 y.o.  Admit date: 10/28/2012 Discharge date: 10/30/2012  Admission Diagnoses:  Patient Active Problem List   Diagnosis Date Noted  . Aortic stenosis 09/30/2012  . Aortic regurgitation 09/30/2012  . Aortic valve disorders 09/30/2012  . Aortic valve disease 09/10/2012   Discharge Diagnoses:   Patient Active Problem List   Diagnosis Date Noted  . 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 Aortic stenosis 09/30/2012  . Aortic regurgitation 09/30/2012  . Aortic valve disorders 09/30/2012  . Aortic valve disease 09/10/2012   Discharged Condition: good  History of Present Illness:   Bruce [name] is a 42 year old white male referred by Bruce [name] for possible surgical treatment of bicuspid aortic valve with aortic stenosis and aortic regurgitation. The patient states that he was first noted to have a heart murmur on physical exam while he was in the [employer] in 1989. 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  He has been followed for several years by Bruce [name] who has been checking routine follow up echocardiograms every 2-3 years. A follow up transthoracic echocardiogram was performed that suggested the patient's aortic stenosis had progressed to the point of becoming severe with peak velocity across the valve approaching 4 m/s corresponding to peak and mean transvalvular gradients estimated to be 61 and 40 mm mercury respectively. Left ventricular ejection fraction was estimated 40%. The patient was referred to Bruce [name] who performed both TEE and left and right heart catheterization. TEE confirmed the presence of what appeared to be a bicuspid aortic valve with at least moderate aortic stenosis and moderate aortic regurgitation. There was at least moderate global left ventricular systolic dysfunction with  ejection fraction estimated 25-30%. Left and right heart catheterization was performed and did not reveal significant coronary artery disease. There was moderate aortic stenosis with mean gradient across the aortic valve by cath measured 25.5 mm mercury. There was moderate aortic insufficiency and the thoracic aorta appeared relatively normal sized.  He was originally evaluated by Bruce [name] on 09/10/2012 at which time the patient was felt to be a surgical candidate.  There were various treatment options available to the patient.  The risks and benefits of these procedures were explained to the patient.  He decided he wished to think about his treatment options.  The patient was again evaluated by Bruce [name] on 09/30/2012 at which time the patient decided to proceed with a Minimally Invasive Aortic Valve Replacement utilizing a Bioprosthetic tissue valve.  The patient was again counseled at length regarding the risk and benefits of the procedure and he was agreeable to proceed.  Surgery was scheduled for 10/28/2012.  Hospital Course:   Bruce [name] presented to [facility] on 10/28/2012.  He was taken to the operating room and underwent Minimally Invasive Aortic Valve Replacement utilizing a 23 mm Edwards Pericardial Tissue Valve.  The patient tolerated the procedure well and was taken to the SICU in stable condition.  During his stay in the ICU the patient was extubated very early the morning after surgery.  The patient was hemodynamically stable and transferred the telemetry unit in stable condition on POD #1.  The patient continues to progress.  He is maintaining NSR and his pacing wires have been removed without difficulty.  The patient had increased incisional pain and was placed on a PCA which was weaned as tolerated.  The patient became febrile at 102.  He had no Leukocytosis or signs of infection.  It was felt that atelectasis was most likely the source of the patient's temperature.  His chest tubes were  removed without difficulty.  He is ambulating without difficulty and tolerating a regular diet.  Should he continue to progress and no further issues arise we anticipate discharge home in the next 24-48 hours.  He will follow up with Bruce [name] on 11/16/2012 with a CXR prior to his appointment.  He will also need to follow up with Bruce [name] in 2-4 weeks.          Significant Diagnostic Studies: Echocardiogram  - Left ventricle: The cavity size was mildly dilated. There was mild concentric hypertrophy. Systolic function was severely reduced. The estimated ejection fraction was in the range of 25% to 30%. Diffuse hypokinesis.  - Aortic valve: Mildly calcified annulus. Possibly bicuspid; moderately calcified leaflets. Calcification. Cusp separation was severely reduced. There was severe stenosis. Valve area: 0.69cm^2(VTI). Valve area: 0.66cm^2  Catheterization:  Procedural data:  RA pressure 10/9 Mean 7 mm mercury. RA saturation 67%.  RV pressure 25/5 and Right ventricular EDP 9 mm Hg.  PA pressure 21/12 with a mean of 16 mm mercury. PA saturation 70%.  Pulmonary capillary wedge 15/15 with a mean of 13 mm Hg. Aortic saturation 97%.  Cardiac output was 4.43 with cardiac index of 2.01 by Fick.  Aortic valve area cavitated at 1.12 cm2. Peak to peak gradient was 44 mmHg, mean gradient was 25.5 mmHg.  Left heart catheterization hemodynamic data: Left ventricle pressure 156/9 with end-diastolic pressure of 15 mmHg.aortic pressure was 131/75 with a mean of 99 mmHg. Aortic valve area cavitated at 1.12 cm2. Peak to peak gradient was 44 mmHg, mean gradient was 25.5 mmHg.  Angiographic data  Left ventricle: performed. Left systolic shows normal ejection fraction of 35-40% The left ventricle is dilated moderately.  Right coronary artery: Smooth, dominant and normal. Large proximal RV branch noted.  Left main coronary artery: Normal. No stenosis.  LAD: Large, smooth and normal. Gives origin to a moderate  sized diagonal 1 which is smooth and normal.  Circumflex coronary artery: Smooth normal. Non-dominant.  Ascending aortogram/ascending thoracic aortogram: Moderate aortic valve calcification evident. Moderate aortic regurgitation. Descending thoracic and abdominal aorta reveals no evidence of abdominal aortic aneurysm or aortic coarctation.  Treatments: surgery:   Minimally Invasive Aortic Valve Replacement Edwards Magna Ease Pericardial Tissue Valve (size 23mm, catalog #[id number], serial [id number])  Disposition: 01-Home or Self Care  Discharge Medications:    Medication List         AIRBORNE PO  Take by mouth daily.     ALPRAZolam 0.5 MG tablet  Commonly known as:  XANAX  Take 0.5 mg by mouth at bedtime as needed. anxiety     aspirin 325 MG EC tablet  Take 1 tablet (325 mg total) by mouth daily.     atorvastatin 80 MG tablet  Commonly known as:  LIPITOR  Take 80 mg by mouth daily.     fluticasone 50 MCG/ACT nasal spray  Commonly known as:  FLONASE  Place 1 spray into the nose daily.     furosemide 40 MG tablet  Commonly known as:  LASIX  Take 1 tablet (40 mg total) by mouth daily.     metoprolol tartrate 25 MG tablet  Commonly known as:  LOPRESSOR  Take 1 tablet (25 mg total) by mouth 2 (two) times daily.  oxyCODONE 5 MG immediate release tablet  Commonly known as:  Oxy IR/ROXICODONE  Take 1-2 tablets (5-10 mg total) by mouth every 6 (six) hours as needed.     potassium chloride SA 20 MEQ tablet  Commonly known as:  K-DUR,KLOR-CON  Take 1 tablet (20 mEq total) by mouth daily.       The patient has been discharged on:   1.Beta Blocker:  Yes [ x  ]                              No   [   ]                              If No, reason:  2.Ace Inhibitor/ARB: Yes [   ]                                     No  [ x   ]                                     If No, reason:  3.Statin:   Yes [   ]                  No  [ x  ]                  If No, reason: NO  CAD  4.Marlowe KaysCarlyle Basques   ]                  No   [   ]                  If No, reason:  Follow-up Information   Follow up with Purcell Nails, MD On 11/16/2012. (Appointment is at 2:30)    Specialty:  Cardiothoracic Surgery   Contact information:   [address] [place] [address] [place] [address] [phone]       Follow up with [facility]. (Please get CXR 1 hour prior to your appointment)    Contact information:   [place]       Follow up with Pamella Pert, MD.   Specialty:  Cardiology   Contact information:   [address] [place] [address] [phone]        Signed: Lowella Dandy 10/30/2012, 12:33 PM

## 2012-10-30 NOTE — Progress Notes (Addendum)
REMOVED EPW (1545)DER. PT TOLERATED WELL. SUTURES ATTACHED TO DERMABOND GLUE; NURSG HAD TO REMOVE DERMABOND, AND APPLY STERI-STRIPS.   TWO HOUR(1800) EPW REMOVED RT SIDE X2 CHEST TUBES REMOVE, SUTURE TIED AND GAUZE DRESSING APPLIED. PT TOLERATED WELL. ENCOURAGED PT TO CALL WITH NEEDS. PT VU.

## 2012-10-30 NOTE — Progress Notes (Addendum)
Attempted to ambulate pt in hallway.  Pt with visible discomfort and verbalized that he was not able to ambulate at this time.  Pt requested rehab staff to come back after his pain had subsided. Alanson Aly, BSN

## 2012-10-30 NOTE — Op Note (Signed)
NAMEDEMICHAEL, TRAUM            ACCOUNT NO.:  [id number]  MEDICAL RECORD NO.:  [id number]  LOCATION:  2W25C                        FACILITY:  [facility]  PHYSICIAN:  Burna Forts, M.D.DATE OF BIRTH:  12-25-1970  DATE OF PROCEDURE:  10/28/2012 DATE OF DISCHARGE:                              OPERATIVE REPORT   PROCEDURE:  Intraoperative transesophageal echocardiogram.  INDICATIONS FOR PROCEDURE:  Mr. Skalski is a 42 year old gentleman, who presents today for a minimally invasive aortic valve replacement to be performed by Dr. Tressie Stalker.  He was brought to the holding area on the morning of surgery where under local anesthesia with sedation, pulmonary arterial lines were placed.  He was then taken to the OR for routine induction of general anesthesia, after which the TEE probe was prepared and passed oropharyngeally into the stomach, then slightly withdrawn for imaging of the cardiac structures.  PRE-CARDIOPULMONARY BYPASS TEE EXAMINATION:  Left ventricle:  The left ventricular chamber is seen initially in the short axis view.  There is mild concentric left ventricular hypertrophy appreciated.  There is mildly reduced left ventricular function appreciated.  Overall, ejection fraction is estimated to be approximately 40%.  All segmental wall areas are contractile, thickening inward.  Papillary muscles are well outlined.  No other masses are appreciated.  Mitral valve:  The mitral valve is thin, compliant, mobile.  Left atrium:  Normal left atrial chamber is appreciated.  Aortic valve:  In the short-axis view of the aortic valve, there was difficulty to see due to the heavy calcium abundant in this valve view. Overall, it appeared to be a functionally bicuspid valve.  On long-axis view, we could better appreciate this severely limited motion of the heavily calcified aortic valve structure.  There was a 3+ turbulent color jet through the valve during systolic ejection.  There was  1+ aortic insufficiency also appreciated across this valve.  Peak and mean measurements are 56 peak measurement of gradient as well as a mean of 36.  Calculated aortic valve area was approximately 0.9-1.  Right ventricle:  The right ventricular chamber was normal.  Tricuspid valve is normal.  Right atrial chamber is normal.  Right atrial chamber is apparent.  Pulmonary artery catheter is noted within.  The patient is placed on cardiopulmonary bypass ultimately.  Hypothermia is begun.  The diseased aortic valve is excised and replaced with a trileaflet pericardial tissue valve.  De-airing maneuvers were completed, and minimal air is noted and the patient is separated from cardiopulmonary bypass with the initial attempt.  POST CARDIOPULMONARY BYPASS TEE EXAMINATION:  Left ventricle:  Left ventricular chamber is seen in the short-axis view early in the post bypass period.  It is vigorous and contractile.  The septum was mildly dyskinetic early in the post bypass period, but improved immediately with time and separation from cardiopulmonary bypass.  Aortic valve:  In the short axis view of the aortic valve, there was a lot of shadowing due to the surrounding structures at the aortic valve level itself.  It was somewhat difficult to see the leaflets but in fact we were able to tell that there was a trileaflet valve.  The valve itself appeared to be seated well.  The  leaflets were opening appropriately.  Multiple views were obtained of both short and long axis views.  In the long-axis views, we looked close for any regurgitant jets or perivalvular leaks.  None of these were apparent in the left ventricular outflow tract suggesting no AI or any perivalvular leak. The repair and replacement appears to be satisfactory.  The rest of the cardiac examination was as previously described without any significant changes.  The patient was returned to the cardiac intensive care unit in  stable condition.          ______________________________ Burna Forts, M.D.     JTM/MEDQ  D:  10/30/2012  T:  10/30/2012  Job:  [id number]

## 2012-10-30 NOTE — Care Management Note (Unsigned)
    Page 1 of 1   10/30/2012     5:25:43 PM   CARE MANAGEMENT NOTE 10/30/2012  Patient:  Bruce [name], Bruce [name]   Account Number:  [id number]  Date Initiated:  10/30/2012  Documentation initiated by:  Dariush Mcnellis  Subjective/Objective Assessment:   PT S/P AVR ON 10/28/12.  PTA, PT INDEPENDENT, LIVES WITH SIGNIFICANT OTHER.     Action/Plan:   WILL FOLLOW FOR HOME NEEDS AS PT PROGRESSES.   Anticipated DC Date:  11/02/2012   Anticipated DC Plan:  HOME W HOME HEALTH SERVICES      DC Planning Services  CM consult      Choice offered to / List presented to:             Status of service:  In process, will continue to follow Medicare Important Message given?   (If response is "NO", the following Medicare IM given date fields will be blank) Date Medicare IM given:   Date Additional Medicare IM given:    Discharge Disposition:    Per UR Regulation:  Reviewed for med. necessity/level of care/duration of stay  If discussed at Long Length of Stay Meetings, dates discussed:    Comments:

## 2012-10-30 NOTE — Progress Notes (Signed)
CARDIAC REHAB PHASE I   PRE:  Rate/Rhythm: 84 SR  BP:  Sitting: 107/68     SaO2: 100 2L  MODE:  Ambulation: 150 ft   POST:  Rate/Rhythm: 99 SR  BP:  Sitting: 93/56    SaO2: 95 RA 1102-1143 Pt in mild pain 3/10 upon arrival.  Very eager to walk.  Pt walked 150 ft with assist x1 and rest x1, with walker.  No c/o during ambulation.  Reviewed IS with pt. Returned pt to bed per his request with call button and phone.  Marvene Staff MS, ACSM RCEP 11:43 AM 10/30/2012

## 2012-10-31 ENCOUNTER — Inpatient Hospital Stay (HOSPITAL_COMMUNITY): Payer: BC Managed Care – PPO

## 2012-10-31 LAB — CBC
HCT: 31.5 % — ABNORMAL LOW (ref 39.0–52.0)
Hemoglobin: 10.5 g/dL — ABNORMAL LOW (ref 13.0–17.0)
MCHC: 33.3 g/dL (ref 30.0–36.0)
MCV: 88 fL (ref 78.0–100.0)

## 2012-10-31 LAB — BASIC METABOLIC PANEL
BUN: 15 mg/dL (ref 6–23)
Chloride: 100 mEq/L (ref 96–112)
Creatinine, Ser: 0.9 mg/dL (ref 0.50–1.35)
Glucose, Bld: 109 mg/dL — ABNORMAL HIGH (ref 70–99)
Potassium: 3.7 mEq/L (ref 3.5–5.1)

## 2012-10-31 LAB — GLUCOSE, CAPILLARY

## 2012-10-31 NOTE — Progress Notes (Addendum)
[address]       [place] [address]             [phone]      3 Days Post-Op  Procedure(s) (LRB): MINIMALLY INVASIVE AORTIC VALVE REPLACEMENT (AVR) (N/A) INTRAOPERATIVE TRANSESOPHAGEAL ECHOCARDIOGRAM (N/A) Subjective: Feels better, some nausea from pain meds   Objective  Telemetry sinus with PVC's  Temp:  [99.3 F (37.4 C)-101.2 F (38.4 C)] 99.3 F (37.4 C) (09/13 0604) Pulse Rate:  [85-93] 85 (09/13 0411) Resp:  [17-20] 19 (09/13 0411) BP: (101-114)/(53-59) 110/59 mmHg (09/13 0411) SpO2:  [94 %-97 %] 95 % (09/13 0411) Weight:  [211 lb 3.2 oz (95.8 kg)] 211 lb 3.2 oz (95.8 kg) (09/13 0500)   Intake/Output Summary (Last 24 hours) at 10/31/12 0853 Last data filed at 10/31/12 0311  Gross per 24 hour  Intake    480 ml  Output   2050 ml  Net  -1570 ml    PE:  Gen: NAD Lungs: dim in right base Cor: RRR, soft systolic murmur Abd: benign Ext: no edema Dressings: CDI   Lab Results:  Recent Labs  10/29/12 0330 10/29/12 1626 10/30/12 0542 10/31/12 0625  NA 137  --  137 135  K 3.5  --  3.7 3.7  CL 108  --  103 100  CO2 20  --  24 23  GLUCOSE 119*  --  117* 109*  BUN 12  --  16 15  CREATININE 0.80 1.03 1.09 0.90  CALCIUM 8.0*  --  8.5 8.8  MG 2.2 2.2  --   --    No results found for this basename: AST, ALT, ALKPHOS, BILITOT, PROT, ALBUMIN,  in the last 72 hours No results found for this basename: LIPASE, AMYLASE,  in the last 72 hours  Recent Labs  10/30/12 0542 10/31/12 0625  WBC 17.5* 14.3*  HGB 11.2* 10.5*  HCT 33.2* 31.5*  MCV 87.6 88.0  PLT 107* 114*   No results found for this basename: CKTOTAL, CKMB, TROPONINI,  in the last 72 hours No components found with this basename: POCBNP,  No results found for this basename: DDIMER,  in the last 72 hours No results found for this basename: HGBA1C,  in the last 72 hours No results found for this basename: CHOL, HDL, LDLCALC, TRIG, CHOLHDL,  in the last 72 hours No results  found for this basename: TSH, T4TOTAL, FREET3, T3FREE, THYROIDAB,  in the last 72 hours No results found for this basename: VITAMINB12, FOLATE, FERRITIN, TIBC, IRON, RETICCTPCT,  in the last 72 hours  Medications: Scheduled . acetaminophen  1,000 mg Oral Q6H  . albuterol  2.5 mg Nebulization TID  . aspirin EC  325 mg Oral Daily  . atorvastatin  80 mg Oral Daily  . bisacodyl  10 mg Oral Daily   Or  . bisacodyl  10 mg Rectal Daily  . docusate sodium  200 mg Oral Daily  . furosemide  40 mg Oral BID  . metoprolol tartrate  25 mg Oral BID  . morphine   Intravenous Q4H  . pantoprazole  40 mg Oral Daily  . potassium chloride  20 mEq Oral BID  . sodium chloride  3 mL Intravenous Q12H  . sodium chloride  3 mL Intravenous Q12H     Radiology/Studies:  Dg Chest 2 View  10/31/2012   *RADIOLOGY REPORT*  Clinical Data: Follow up atelectasis  CHEST - 2 VIEW  Comparison: Prior chest x-ray 10/30/2012  Findings: The right-sided thoracostomy tube has been removed. There is a trace right apical pneumothorax.  Surgical changes of aortic valve replacement.  Malleable plate and screw ORIF of the posterior aspect of the right seventh rib.  Persistent but improving patchy opacities throughout the might right mid lung and base.  Mild cardiomegaly is unchanged.  Small bilateral pleural effusions.  No acute osseous abnormality.  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  IMPRESSION:  1.  Trace right apical pneumothorax following removal of the chest tube. 2.  Persistent but improving patchy opacities in the right mid lung and right base likely reflecting areas of atelectasis, pulmonary contusion and / or infiltrate. 3.  Trace bilateral pleural effusions.   Original Report Authenticated By: Malachy Moan, M.D.   Dg Chest Port 1 View  10/30/2012   *RADIOLOGY REPORT*  Clinical Data:  Atelectasis  PORTABLE CHEST - 1 VIEW  Comparison: October 29, 2012  Findings: There is a chest tube on the right.   No pneumothorax. Swan-Ganz catheter has been removed.  Right jugular catheter has been removed.  There is a chest tube on the left which is stable in appearance.  Temporary pacemaker wires are present.  There is patchy atelectasis in the right base, perhaps slightly increased.  Lungs elsewhere clear.  Heart is prominent with normal pulmonary vascularity, stable.  The patient is status post aortic valve replacement.  IMPRESSION: Slight increase in right base atelectasis.  No pneumothorax.   Original Report Authenticated By: Bretta Bang, M.D.    INR: Will add last result for INR, ABG once components are confirmed Will add last 4 CBG results once components are confirmed  Assessment/Plan: S/P Procedure(s) (LRB): MINIMALLY INVASIVE AORTIC VALVE REPLACEMENT (AVR) (N/A) INTRAOPERATIVE TRANSESOPHAGEAL ECHOCARDIOGRAM (N/A)  1 doing well 2 D/C PCA 3 cont pulm toilet/mobilize 4 labs stable, leukocytosis improved, fever curve improved 5 poss home in am   LOS: 3 days    GOLD,WAYNE E 9/13/20148:53 AM  I have seen and examined Bruce [name] and agree with the above assessment  and plan.  Delight Ovens MD Beeper [phone] Office [phone] 10/31/2012 1:01 PM

## 2012-10-31 NOTE — Progress Notes (Signed)
Ambulated in hallway with 1 assist and rolling walker, 450 ft, tolerated well Egbert Garibaldi A

## 2012-10-31 NOTE — Progress Notes (Signed)
Nursing Patient does not currently have pacing wires at this time. None to remove. Will monitor patient. Fayne Mcguffee, Randall An RN

## 2012-10-31 NOTE — Progress Notes (Signed)
CARDIAC REHAB PHASE I   PRE:  Rate/Rhythm: 90 SR  BP:  Supine: 112/71 Sitting:   Standing:    SaO2: 96% RA  MODE:  Ambulation: 350 ft   POST:  Rate/Rhythem: 96  BP:  Supine:   Sitting: 128/64  Standing:    SaO2: 96% RA  4098-1191- Tolerated ambulation fair with assist x1 and pushing rolling walker. C/o incisional pain, which he rated a 5/10 on the pain scale and c/o SOB. Reviewed d/c education including IS use, restrictions, risk factor modification and activity progression. Discussed Phase 2 cardiac rehab and pt is interested. Permission given to send contact info to [facility] [place] cardiac rehab program.  Annetta Maw

## 2012-11-01 LAB — BASIC METABOLIC PANEL
BUN: 16 mg/dL (ref 6–23)
CO2: 24 mEq/L (ref 19–32)
Calcium: 9.1 mg/dL (ref 8.4–10.5)
Chloride: 100 mEq/L (ref 96–112)
Creatinine, Ser: 0.9 mg/dL (ref 0.50–1.35)
GFR calc Af Amer: >90 mL/min (ref >90)
GFR calc non Af Amer: >90 mL/min (ref >90)
Glucose, Bld: 99 mg/dL (ref 70–99)
Potassium: 3.4 mEq/L — ABNORMAL LOW (ref 3.5–5.1)
Sodium: 138 mEq/L (ref 135–145)

## 2012-11-01 LAB — CBC
HCT: 30.8 % — ABNORMAL LOW (ref 39.0–52.0)
Hemoglobin: 10.2 g/dL — ABNORMAL LOW (ref 13.0–17.0)
MCH: 29.2 pg (ref 26.0–34.0)
MCHC: 33.1 g/dL (ref 30.0–36.0)
MCV: 88.3 fL (ref 78.0–100.0)
Platelets: 166 10*3/uL (ref 150–400)
RBC: 3.49 MIL/uL — ABNORMAL LOW (ref 4.22–5.81)
RDW: 13.5 % (ref 11.5–15.5)
WBC: 10.7 10*3/uL — ABNORMAL HIGH (ref 4.0–10.5)

## 2012-11-01 MED ORDER — FUROSEMIDE 40 MG PO TABS: 40.0000 mg | ORAL_TABLET | Freq: Every day | ORAL | Status: DC

## 2012-11-01 MED ORDER — ASPIRIN 325 MG PO TBEC: 325.0000 mg | DELAYED_RELEASE_TABLET | Freq: Every day | ORAL | Status: DC

## 2012-11-01 MED ORDER — POTASSIUM CHLORIDE CRYS ER 20 MEQ PO TBCR: 20.0000 meq | EXTENDED_RELEASE_TABLET | Freq: Every day | ORAL | Status: DC

## 2012-11-01 MED ORDER — OXYCODONE HCL 5 MG PO TABS: 5.0000 mg | ORAL_TABLET | Freq: Four times a day (QID) | ORAL | Status: DC | PRN

## 2012-11-01 MED ORDER — METOPROLOL TARTRATE 25 MG PO TABS: 25.0000 mg | ORAL_TABLET | Freq: Two times a day (BID) | ORAL | Status: DC

## 2012-11-01 NOTE — Progress Notes (Signed)
Discharge instructions given to pt along with new prescriptions. Pt verbalized understanding. Pt is stable for discharge with ride.

## 2012-11-01 NOTE — Progress Notes (Signed)
[address]       [place] [address]             [phone]      4 Days Post-Op  Procedure(s) (LRB): MINIMALLY INVASIVE AORTIC VALVE REPLACEMENT (AVR) (N/A) INTRAOPERATIVE TRANSESOPHAGEAL ECHOCARDIOGRAM (N/A) Subjective: Feels better, improving each day  Objective  Telemetry sinus rhythm  Temp:  [98.6 F (37 C)-99.5 F (37.5 C)] 98.6 F (37 C) (09/14 0430) Pulse Rate:  [80-88] 80 (09/14 0430) Resp:  [16-18] 18 (09/14 0430) BP: (100-116)/(49-64) 100/49 mmHg (09/14 0430) SpO2:  [96 %-97 %] 97 % (09/14 0430) Weight:  [205 lb 9.6 oz (93.26 kg)] 205 lb 9.6 oz (93.26 kg) (09/14 0406)   Intake/Output Summary (Last 24 hours) at 11/01/12 0807 Last data filed at 10/31/12 2127  Gross per 24 hour  Intake    243 ml  Output    250 ml  Net     -7 ml       General appearance: alert, cooperative and no distress Heart: regular rate and rhythm Lungs: clear to auscultation bilaterally Abdomen: benign Extremities: min edema Wound: incisions healing well  Lab Results:  Recent Labs  10/29/12 1626  10/31/12 0625 11/01/12 0510  NA  --   < > 135 138  K  --   < > 3.7 3.4*  CL  --   < > 100 100  CO2  --   < > 23 24  GLUCOSE  --   < > 109* 99  BUN  --   < > 15 16  CREATININE 1.03  < > 0.90 0.90  CALCIUM  --   < > 8.8 9.1  MG 2.2  --   --   --   < > = values in this interval not displayed. No results found for this basename: AST, ALT, ALKPHOS, BILITOT, PROT, ALBUMIN,  in the last 72 hours No results found for this basename: LIPASE, AMYLASE,  in the last 72 hours  Recent Labs  10/31/12 0625 11/01/12 0510  WBC 14.3* 10.7*  HGB 10.5* 10.2*  HCT 31.5* 30.8*  MCV 88.0 88.3  PLT 114* 166   No results found for this basename: CKTOTAL, CKMB, TROPONINI,  in the last 72 hours No components found with this basename: POCBNP,  No results found for this basename: DDIMER,  in the last 72 hours No results found for this basename: HGBA1C,  in the last 72  hours No results found for this basename: CHOL, HDL, LDLCALC, TRIG, CHOLHDL,  in the last 72 hours No results found for this basename: TSH, T4TOTAL, FREET3, T3FREE, THYROIDAB,  in the last 72 hours No results found for this basename: VITAMINB12, FOLATE, FERRITIN, TIBC, IRON, RETICCTPCT,  in the last 72 hours  Medications: Scheduled . acetaminophen  1,000 mg Oral Q6H  . aspirin EC  325 mg Oral Daily  . atorvastatin  80 mg Oral Daily  . bisacodyl  10 mg Oral Daily   Or  . bisacodyl  10 mg Rectal Daily  . docusate sodium  200 mg Oral Daily  . furosemide  40 mg Oral BID  . metoprolol tartrate  25 mg Oral BID  . pantoprazole  40 mg Oral Daily  . potassium chloride  20 mEq Oral BID  . sodium chloride  3 mL Intravenous Q12H  . sodium chloride  3 mL Intravenous Q12H     Radiology/Studies:  Dg Chest 2 View  10/31/2012   *RADIOLOGY REPORT*  Clinical Data: Follow up atelectasis  CHEST - 2 VIEW  Comparison: Prior chest x-ray 10/30/2012  Findings: The right-sided thoracostomy tube has been removed. There is a trace right apical pneumothorax.  Surgical changes of aortic valve replacement.  Malleable plate and screw ORIF of the posterior aspect of the right seventh rib.  Persistent but improving patchy opacities throughout the might right mid lung and base.  Mild cardiomegaly is unchanged.  Small bilateral pleural effusions.  No acute osseous abnormality.  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  IMPRESSION:  1.  Trace right apical pneumothorax following removal of the chest tube. 2.  Persistent but improving patchy opacities in the right mid lung and right base likely reflecting areas of atelectasis, pulmonary contusion and / or infiltrate. 3.  Trace bilateral pleural effusions.   Original Report Authenticated By: Malachy Moan, M.D.   Dg Chest Port 1 View  10/30/2012   *RADIOLOGY REPORT*  Clinical Data:  Atelectasis  PORTABLE CHEST - 1 VIEW  Comparison: October 29, 2012   Findings: There is a chest tube on the right.  No pneumothorax. Swan-Ganz catheter has been removed.  Right jugular catheter has been removed.  There is a chest tube on the left which is stable in appearance.  Temporary pacemaker wires are present.  There is patchy atelectasis in the right base, perhaps slightly increased.  Lungs elsewhere clear.  Heart is prominent with normal pulmonary vascularity, stable.  The patient is status post aortic valve replacement.  IMPRESSION: Slight increase in right base atelectasis.  No pneumothorax.   Original Report Authenticated By: Bretta Bang, M.D.    INR: Will add last result for INR, ABG once components are confirmed Will add last 4 CBG results once components are confirmed  Assessment/Plan: S/P Procedure(s) (LRB): MINIMALLY INVASIVE AORTIC VALVE REPLACEMENT (AVR) (N/A) INTRAOPERATIVE TRANSESOPHAGEAL ECHOCARDIOGRAM (N/A) Plan for discharge: see discharge orders   LOS: 4 days    GOLD,WAYNE E 9/14/20148:07 AM

## 2012-11-01 NOTE — Progress Notes (Signed)
Removed pt's chest tube sutures per order and placed steri strips and benzoin on site. Pt tolerated well.

## 2012-11-06 NOTE — Anesthesia Postprocedure Evaluation (Signed)
  Anesthesia Post-op Note  Patient: Bruce [name]  Procedure(s) Performed: Procedure(s): MINIMALLY INVASIVE AORTIC VALVE REPLACEMENT (AVR) (N/A) INTRAOPERATIVE TRANSESOPHAGEAL ECHOCARDIOGRAM (N/A)  Patient Location: SICU  Anesthesia Type:General  Level of Consciousness: Patient remains intubated per anesthesia plan  Airway and Oxygen Therapy: Patient remains intubated per anesthesia plan  Post-op Pain: none  Post-op Assessment: Post-op Vital signs reviewed  Post-op Vital Signs: stable  Complications: No apparent anesthesia complications

## 2012-11-11 ENCOUNTER — Other Ambulatory Visit: Payer: Self-pay | Admitting: *Deleted

## 2012-11-11 DIAGNOSIS — I350 Nonrheumatic aortic (valve) stenosis: Secondary | ICD-10-CM

## 2012-11-12 ENCOUNTER — Encounter (HOSPITAL_COMMUNITY): Payer: Self-pay | Admitting: Thoracic Surgery (Cardiothoracic Vascular Surgery)

## 2012-11-16 ENCOUNTER — Encounter: Payer: Self-pay | Admitting: Thoracic Surgery (Cardiothoracic Vascular Surgery)

## 2012-11-16 ENCOUNTER — Ambulatory Visit
Admission: RE | Admit: 2012-11-16 | Discharge: 2012-11-16 | Disposition: A | Discharge status: Home / Self Care | Payer: BC Managed Care – PPO | Source: Ambulatory Visit | Attending: Thoracic Surgery (Cardiothoracic Vascular Surgery) | Admitting: Thoracic Surgery (Cardiothoracic Vascular Surgery)

## 2012-11-16 ENCOUNTER — Ambulatory Visit (INDEPENDENT_AMBULATORY_CARE_PROVIDER_SITE_OTHER): Payer: Self-pay | Admitting: Thoracic Surgery (Cardiothoracic Vascular Surgery)

## 2012-11-16 VITALS — BP 109/67 | HR 91 | Resp 16 | Ht 72.0 in | Wt 204.0 lb

## 2012-11-16 DIAGNOSIS — I351 Nonrheumatic aortic (valve) insufficiency: Secondary | ICD-10-CM

## 2012-11-16 DIAGNOSIS — Z952 Presence of prosthetic heart valve: Secondary | ICD-10-CM | POA: Insufficient documentation

## 2012-11-16 DIAGNOSIS — Z954 Presence of other heart-valve replacement: Secondary | ICD-10-CM

## 2012-11-16 DIAGNOSIS — I350 Nonrheumatic aortic (valve) stenosis: Secondary | ICD-10-CM

## 2012-11-16 DIAGNOSIS — Z953 Presence of xenogenic heart valve: Secondary | ICD-10-CM

## 2012-11-16 DIAGNOSIS — I359 Nonrheumatic aortic valve disorder, unspecified: Secondary | ICD-10-CM

## 2012-11-16 NOTE — Patient Instructions (Signed)
The patient may return to driving an automobile as long as they are no longer requiring oral narcotic pain relievers during the daytime.  It would be wise to start driving only short distances during the daylight and gradually increase from there as they feel comfortable.  The patient may continue to gradually increase their physical activity as tolerated.  They should refrain from any heavy lifting or strenuous use of their arms and shoulders until at least 8 weeks from the time of their surgery, and they should avoid activities that cause increased pain in their chest on the side of their surgical incision.  Otherwise they may continue to increase their activities without any particular limitations.

## 2012-11-16 NOTE — Progress Notes (Signed)
[address]       [name] [address]             [phone]     CARDIOTHORACIC SURGERY OFFICE NOTE  Referring Provider is Pamella Pert, MD PCP is Hoyle Sauer, MD   HPI:  Patient returns for routine followup status post aortic valve replacement using a bioprosthetic tissue valve via right mini thoracotomy approach on 10/28/2012. Patient's postoperative recovery has been uncomplicated.  Since hospital discharge the patient has continued to do well. He was seen in followup last week by Dr. Jacinto Halim and he returns to our office for routine followup today. He still has soreness in his chest but this continues to gradually improve. He is now using pain relievers only at night to help him sleep. His appetite is good. He gets tired quite easily but he is walking a fair amount everyday and he has noticed gradual improvement. Overall he has no specific complaints.   Current Outpatient Prescriptions  Medication Sig Dispense Refill  . ALPRAZolam (XANAX) 0.5 MG tablet Take 0.5 mg by mouth at bedtime as needed. anxiety      . aspirin EC 325 MG EC tablet Take 1 tablet (325 mg total) by mouth daily.      Marland Kitchen atorvastatin (LIPITOR) 80 MG tablet Take 80 mg by mouth daily.       . fluticasone (FLONASE) 50 MCG/ACT nasal spray Place 1 spray into the nose daily.       . metoprolol tartrate (LOPRESSOR) 25 MG tablet Take 1 tablet (25 mg total) by mouth 2 (two) times daily.  60 tablet  1  . Multiple Vitamins-Minerals (AIRBORNE PO) Take by mouth daily.      Marland Kitchen oxyCODONE (OXY IR/ROXICODONE) 5 MG immediate release tablet Take 1-2 tablets (5-10 mg total) by mouth every 6 (six) hours as needed.  50 tablet  0  . furosemide (LASIX) 40 MG tablet Take 1 tablet (40 mg total) by mouth daily.  7 tablet  0  . potassium chloride SA (K-DUR,KLOR-CON) 20 MEQ tablet Take 1 tablet (20 mEq total) by mouth daily.  7 tablet  0   No current facility-administered medications for this visit.       Physical Exam:   BP 109/67  Pulse 91  Resp 16  Ht 6' (1.829 m)  Wt 204 lb (92.534 kg)  BMI 27.66 kg/m2  SpO2 98%  General:  Well-appearing  Chest:   Clear to auscultation with symmetrical breath sounds  CV:   Regular rate and rhythm without murmur  Incisions:  Clean and dry and healing nicely  Abdomen:  Soft nontender  Extremities:  Warm and well-perfused  Diagnostic Tests:  CLINICAL DATA: Postop aortic valve replacement  EXAM:  CHEST 2 VIEW  COMPARISON: 10/31/2012  FINDINGS:  Aortic valve replacement. Valve positioning is unchanged and  satisfactory.  Negative for pneumothorax.  Improved aeration in the right lung compared with the prior study.  Minimal pleural effusion bilaterally. Negative for heart failure or  edema.  IMPRESSION:  Interval improvement in airspace disease on the right.  No pneumothorax  Minimal pleural effusion bilaterally.  Electronically Signed  By: Marlan Palau M.D.  On: 11/16/2012 13:36    Impression:  The patient is doing quite well just over 2 weeks following minimally invasive aortic valve replacement using a bioprosthetic tissue valve  Plan:  I've encouraged patient to continue to gradually increase his physical activity as tolerated but I've reminded him to refrain  from any heavy lifting or strenuous use of his arms or shoulders for least another 6 weeks. I think he may resume driving an automobile. We have not made any changes to his current medications. All of his questions been addressed. We will plan to see him back in 6 weeks for routine followup.   Salvatore Decent. Cornelius Moras, MD 11/16/2012 2:19 PM

## 2012-12-28 ENCOUNTER — Encounter: Payer: Self-pay | Admitting: Thoracic Surgery (Cardiothoracic Vascular Surgery)

## 2012-12-28 ENCOUNTER — Ambulatory Visit (INDEPENDENT_AMBULATORY_CARE_PROVIDER_SITE_OTHER): Payer: Self-pay | Admitting: Thoracic Surgery (Cardiothoracic Vascular Surgery)

## 2012-12-28 VITALS — BP 125/70 | HR 72 | Resp 18 | Ht 72.0 in | Wt 205.0 lb

## 2012-12-28 DIAGNOSIS — Z952 Presence of prosthetic heart valve: Secondary | ICD-10-CM

## 2012-12-28 DIAGNOSIS — Z953 Presence of xenogenic heart valve: Secondary | ICD-10-CM

## 2012-12-28 NOTE — Patient Instructions (Signed)
The patient may continue to gradually increase their physical activity as tolerated.  He should refrain from any heavy lifting or strenuous use of their arms and shoulders until all of the soreness in his chest has resolved, and he should avoid activities that cause increased pain in their chest on the side of their surgical incision.  Otherwise they may continue to increase their activities without any particular limitations.

## 2012-12-28 NOTE — Progress Notes (Signed)
[address]       [name] [address]             [phone]     CARDIOTHORACIC SURGERY OFFICE NOTE  Referring Provider is Pamella Pert, MD PCP is Hoyle Sauer, MD   HPI:  Patient returns for routine followup status post aortic valve replacement using a bioprosthetic tissue valve via right mini thoracotomy approach on 10/28/2012.  His postoperative recovery has been uncomplicated. He was last seen here in the office 11/16/2012.  Since then he has continued to do fairly well. He underwent a follow up echocardiogram at Dr. Verl Dicker office on 12/10/2012 and he was seen in the office last week by Dr. Jacinto Halim.  His left ventricular ejection fraction is moderately reduced with ejection fraction estimated 35-42%, although this is unchanged from preoperatively. The patient reports that he is doing fairly well. He still has significant soreness in his chest which particularly bothersome when he tries to lay on his side sleeping at night. His exercise tolerance is otherwise slowly improving. He states that his breathing is better than it was prior to surgery. He notes that prior to surgery he oftentimes felt like he was gasping for his breath even with relatively mild physical activity or after eating a meal. This seems to be considerably improved. He reports no other problems or complaints.    Current Outpatient Prescriptions  Medication Sig Dispense Refill  . ALPRAZolam (XANAX) 0.5 MG tablet Take 0.5 mg by mouth at bedtime as needed. anxiety      . aspirin EC 325 MG EC tablet Take 1 tablet (325 mg total) by mouth daily.      Marland Kitchen atorvastatin (LIPITOR) 80 MG tablet Take 80 mg by mouth daily.       . fluticasone (FLONASE) 50 MCG/ACT nasal spray Place 1 spray into the nose daily.       Marland Kitchen lisinopril (PRINIVIL,ZESTRIL) 2.5 MG tablet Take 2.5 mg by mouth daily.      . metoprolol (LOPRESSOR) 50 MG tablet Take 50 mg by mouth 2 (two) times daily.      . Multiple  Vitamins-Minerals (AIRBORNE PO) Take by mouth daily.      Marland Kitchen oxyCODONE (OXY IR/ROXICODONE) 5 MG immediate release tablet Take 1-2 tablets (5-10 mg total) by mouth every 6 (six) hours as needed.  50 tablet  0   No current facility-administered medications for this visit.      Physical Exam:   BP 125/70  Pulse 72  Resp 18  Ht 6' (1.829 m)  Wt 205 lb (92.987 kg)  BMI 27.80 kg/m2  SpO2 98%  General:  Well-appearing  Chest:   Clear to auscultation with symmetrical breath sounds  CV:   Regular rate and rhythm with a soft systolic murmur along the sternal border. No diastolic murmur is appreciated  Incisions:  Clean and dry and healing nicely. Chest wall remains mildly tender to palpation  Abdomen:  Soft nontender  Extremities:  Warm and well-perfused  Diagnostic Tests:  Both images and report from transthoracic echocardiogram performed at [facility] on 12/10/2012 are reviewed.  Left ventricular systolic function is mild to moderately reduced with ejection fraction estimated between 35 and 42%. There is mild left ventricular chamber enlargement. This is similar to the patient's preoperative left ventricular function. There is a bioprosthetic tissue valve in the aortic position. Peak velocity across the aortic valve was measured 2.28 m/s corresponding to peak and mean transvalvular gradients of  21 and 11 mm mercury respectively which is perfectly appropriate for a 21 mm stented bioprosthetic tissue valve. There appears to be a perivalvular leak which was characterized as mild to moderate in severity.  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 Quantification is difficult on my personal review of these images.   Impression:  The patient is clinically doing well proximally 2 months following minimally invasive aortic valve replacement using a bioprosthetic tissue valve. Followup  echocardiogram demonstrates stable left ventricular systolic function with ejection fraction in the neighborhood of 35-40%, similar to preoperatively. However, followup echocardiogram also suggest the presence of a paravalvular leak. I do not hear a diastolic murmur on exam and the transvalvular gradient is not elevated as would be expected because of increased flow in the setting of a hemodynamically significant leak. There was no sign of paravalvular leak by transesophageal echocardiogram performed at the time of surgery immediately following valve replacement.  At this point the patient is clinically doing well and reports overall improvement in his exercise tolerance in comparison with preoperatively.    Plan:  I've encouraged patient to continue to gradually increase his physical activity as tolerated but I have reminded him to refrain from heavy lifting or strenuous use of his arms or shoulders until all the soreness in his chest wall has resolved.  I would favor repeat transthoracic echocardiogram in 3 - 6 months as long as the patient continues to do well clinically, or sooner if any clinical problems develop.  Transesophageal echocardiogram could be considered if further anatomical characterization of the paravalvular leak is indicated.  We will plan to see him back in 3 months.    Salvatore Decent. Cornelius Moras, MD 12/28/2012 3:15 PM

## 2013-03-25 DIAGNOSIS — T8203XA Leakage of heart valve prosthesis, initial encounter: Secondary | ICD-10-CM

## 2013-03-25 HISTORY — DX: Leakage of heart valve prosthesis, initial encounter: T82.03XA

## 2013-04-05 ENCOUNTER — Ambulatory Visit (INDEPENDENT_AMBULATORY_CARE_PROVIDER_SITE_OTHER): Payer: BC Managed Care – PPO | Admitting: Thoracic Surgery (Cardiothoracic Vascular Surgery)

## 2013-04-05 ENCOUNTER — Encounter: Payer: Self-pay | Admitting: Thoracic Surgery (Cardiothoracic Vascular Surgery)

## 2013-04-05 VITALS — BP 120/80 | HR 78 | Resp 20 | Ht 72.0 in | Wt 205.0 lb

## 2013-04-05 DIAGNOSIS — I351 Nonrheumatic aortic (valve) insufficiency: Secondary | ICD-10-CM

## 2013-04-05 DIAGNOSIS — Z952 Presence of prosthetic heart valve: Secondary | ICD-10-CM

## 2013-04-05 DIAGNOSIS — I359 Nonrheumatic aortic valve disorder, unspecified: Secondary | ICD-10-CM

## 2013-04-05 DIAGNOSIS — I350 Nonrheumatic aortic (valve) stenosis: Secondary | ICD-10-CM

## 2013-04-05 DIAGNOSIS — Z953 Presence of xenogenic heart valve: Secondary | ICD-10-CM

## 2013-04-05 NOTE — Patient Instructions (Signed)
Patient may resume normal physical activity without any particular limitations at this time, and return to our office only as needed should any further problems or questions arise.  

## 2013-04-05 NOTE — Progress Notes (Signed)
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 His postoperative recovery was uncomplicated and he was last seen here in the office 12/28/2012.  Transthoracic echocardiogram performed 12/10/2012 was felt to be suggestive of a "mild to moderate" paravalvular leak.  The patient recently under went a followup echocardiogram and was seen in the office by Dr. Jacinto HalimGanji last week. He returns for office today for further followup.  Denies any symptoms of exertional shortness of breath, PND, orthopnea, or lower extremity edema. He has not had any dizzy spells. He states he has some occasional pains that radiate across both sides of his chest that only seem to bother him at night when he is trying to sleep. These pains are described as sharp pains in typically located more on the left side. They're not related to physical activity or position and really seem to bother him only at night.  He also states that sometimes when he sleeps on his left side the left side of his body will go numb for a period of time. He still has occasional transient visual disturbances in the left eye which are unchanged from preoperatively.    Current Outpatient Prescriptions  Medication Sig Dispense Refill  . ALPRAZolam (XANAX) 0.5 MG tablet Take 0.5 mg by mouth at bedtime as needed. anxiety      . aspirin EC 325 MG EC tablet Take 1 tablet (325 mg total) by mouth daily.      Marland Kitchen. atorvastatin (LIPITOR) 80 MG tablet Take 80 mg by mouth daily.       . carvedilol (COREG) 3.125 MG tablet Take 3.125 mg by mouth 2 (two) times daily with a meal.      . fluticasone (FLONASE) 50 MCG/ACT nasal spray Place 1 spray into the  nose daily.       Marland Kitchen. lisinopril (PRINIVIL,ZESTRIL) 2.5 MG tablet Take 2.5 mg by mouth daily.      . Multiple Vitamins-Minerals (AIRBORNE PO) Take by mouth daily.      Marland Kitchen. oxyCODONE (OXY IR/ROXICODONE) 5 MG immediate release tablet Take 1-2 tablets (5-10 mg total) by mouth every 6 (six) hours as needed.  50 tablet  0   No current facility-administered medications for this visit.      Physical Exam:   BP 120/80  Pulse 78  Resp 20  Ht 6' (1.829 m)  Wt 205 lb (92.987 kg)  BMI 27.80 kg/m2  SpO2 98%  General:  Well-appearing  Chest:   Clear to auscultation  CV:   Regular rate and rhythm with soft systolic murmur heard best at the left lower sternal border. No diastolic murmurs are noted.  Incisions:  Completely healed, costal cartilages stable  Abdomen:  Soft and nontender  Extremities:  Warm and well-perfused  Diagnostic Tests:  Both the report and the images from echocardiograms performed 12/10/2012 and 03/25/2013 are reviewed. The most recent echocardiogram is far superior in quality when compared with the echocardiogram from last fall.  Left ventricular chamber size is normal with mild to moderate concentric left ventricular hypertrophy. There is mildly decreased left ventricular systolic function with ejection fraction calculated  45%. There is bioprosthetic tissue valve in the aortic position that is functioning normally. There is very mild aortic insufficiency which is somewhat eccentrically located, most consistent with a very small perivalvular leak.  The transvalvular gradients are normal. No other abnormalities are noted.   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 Followup echocardiogram demonstrates normal expected transvalvular gradient across the valve with perhaps a very small (trace to mild) paravalvular leak.  Left ventricular function is mildly reduced and  appears to have improved in comparison with prior to surgery. Patient continues to have occasional transient visual disturbances in his left eye which were present prior to surgery. I do not think these symptoms have anything to do with his aortic valve. Similarly, the patient has occasional sharp pains radiating across his chest. Although these could be related to the surgical procedure his chest wall appears to have healed nicely and his procedure was performed via mini thoracotomy approach without any sternotomy whatsoever.    Plan:  I've advised the patient that he may increase his physical activity without any particular restrictions related to surgery at this time. We discussed the implications of possible small perivalvular leak. At this point the patient is clinically doing very well and I would agree with a plan for routine surveillance using followup transthoracic echocardiogram as outlined by Dr. Jacinto Halim.  The patient will return to see [place] for routine followup next September.   Salvatore Decent. Cornelius Moras, MD 04/05/2013 12:19 PM

## 2013-11-29 ENCOUNTER — Ambulatory Visit (INDEPENDENT_AMBULATORY_CARE_PROVIDER_SITE_OTHER): Payer: BC Managed Care – PPO | Admitting: Thoracic Surgery (Cardiothoracic Vascular Surgery)

## 2013-11-29 ENCOUNTER — Encounter: Payer: Self-pay | Admitting: Thoracic Surgery (Cardiothoracic Vascular Surgery)

## 2013-11-29 VITALS — BP 120/79 | HR 78 | Resp 20 | Ht 72.0 in | Wt 205.0 lb

## 2013-11-29 DIAGNOSIS — Z953 Presence of xenogenic heart valve: Secondary | ICD-10-CM

## 2013-11-29 DIAGNOSIS — I359 Nonrheumatic aortic valve disorder, unspecified: Secondary | ICD-10-CM

## 2013-11-29 DIAGNOSIS — T8203XD Leakage of heart valve prosthesis, subsequent encounter: Secondary | ICD-10-CM

## 2013-11-29 DIAGNOSIS — Z954 Presence of other heart-valve replacement: Secondary | ICD-10-CM

## 2013-11-29 NOTE — Progress Notes (Signed)
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  His postoperative recovery was uncomplicated although followup echocardiograms have demonstrated the presence of a small paravalvular leak.  He was last seen here in our office on 04/05/2013. Since then he has been followed carefully by Dr. Jacinto Halim who last saw him in the office on 07/14/2013.  Repeat transthoracic echocardiogram performed at that time reportedly remained stable with left ventricular ejection fraction estimated 40-45% and stable "mild" paravalvular leak. The patient returns to our office today for followup. He states that over the past 6 months he has done well. He reports that he still has some exertional shortness of breath and fatigue, but he feels improved in comparison with how he felt before his surgery. He states that his exercise tolerance has slowly improved over the past 6 months. He now gets short of breath only with relatively strenuous activity and it reportedly does not significantly limit his physical activities. The postoperative pain in his chest has resolved and he now only occasionally will feel a twinges discomfort across his chest if he moves suddenly.       Current Outpatient Prescriptions  Medication Sig Dispense Refill  . ALPRAZolam (XANAX) 0.5 MG tablet Take 0.5 mg by mouth at bedtime as needed. anxiety      . aspirin EC 325 MG EC tablet Take 1 tablet (325 mg total) by mouth daily.      Marland Kitchen atorvastatin (LIPITOR) 80 MG tablet Take 80 mg by mouth daily.       . carvedilol (COREG) 3.125 MG tablet Take 3.125 mg by mouth 2 (two) times daily with a meal.      . fluticasone  (FLONASE) 50 MCG/ACT nasal spray Place 1 spray into the nose daily.       Marland Kitchen lisinopril (PRINIVIL,ZESTRIL) 2.5 MG tablet Take 2.5 mg by mouth daily.      . Multiple Vitamins-Minerals (AIRBORNE PO) Take by mouth daily.      Marland Kitchen oxyCODONE (OXY IR/ROXICODONE) 5 MG immediate release tablet Take 1-2 tablets (5-10 mg total) by mouth every 6 (six) hours as needed.  50 tablet  0  . valsartan (DIOVAN) 80 MG tablet Take 80 mg by mouth daily.        No current facility-administered medications for this visit.      Physical Exam:   BP 120/79  Pulse 78  Resp 20  Ht 6' (1.829 m)  Wt 205 lb (92.987 kg)  BMI 27.80 kg/m2  SpO2 %  General:  Well-appearing  Chest:   Clear  CV:   Regular rate and rhythm with soft systolic murmur, no diastolic murmur appreciated  Incisions:  Completely healed  Abdomen:  Soft  Extremities:  Warm  Diagnostic Tests:  TRANSTHORACIC ECHOCARDIOGRAM  Report of transthoracic echocardiogram performed 07/08/2013 is reviewed.  Images of this examination are not currently available for review. By report there was moderate concentric hypertrophy of the left ventricle with ejection fraction estimated 40-45%. There was a bioprosthetic tissue valve in the aortic position with "mild paravalvular regurgitation". Peak velocity across the aortic valve was reported  1.95 m/s corresponding to peak and mean transvalvular gradients of 16 and 8 mm mercury, respectively. By report these findings were unchanged in comparison with previous echocardiogram performed 03/25/2013.   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  Followup echocardiograms have demonstrated the presence of a small paravalvular leak but otherwise a normal functioning valve with normal transvalvular gradients.  Left ventricular systolic function appears unchanged or perhaps slightly improved in comparison with preoperative  echocardiograms.  The patient is clinically doing well with stable symptoms of exertional shortness of breath and fatigue consistent with chronic combined systolic and diastolic congestive heart failure, New York Heart Association functional class 1-2.  The patient states that his exercise tolerance is improved in comparison with how he felt prior to surgery.     Plan:  The patient will continue to followup with Dr. Jacinto HalimGanji for serial echocardiograms and further adjustment to his long-term medical therapy as needed.  He has been reminded regarding the life long need for antibiotic prophylaxis 12 dental cleaning and related procedures.  We will plan to see the patient back in one year just make sure that he continues to do well and to review the results of his most recent echocardiogram.  I spent in excess of 15 minutes during the conduct of this office consultation and >50% of this time involved direct face-to-face encounter with the patient for counseling and/or coordination of their care.   Salvatore Decentlarence H. Cornelius Moraswen, MD 11/29/2013 5:29 PM

## 2013-11-29 NOTE — Patient Instructions (Signed)

## 2013-12-06 ENCOUNTER — Ambulatory Visit: Payer: BC Managed Care – PPO | Admitting: Thoracic Surgery (Cardiothoracic Vascular Surgery)

## 2014-01-27 ENCOUNTER — Encounter (HOSPITAL_COMMUNITY): Payer: Self-pay | Admitting: Cardiology

## 2014-11-12 IMAGING — CR DG CHEST 2V
2 series · 2 of 2 positions shown · non-contrast
Comparison: None.

CLINICAL DATA: Preoperative evaluation for cardiac surgery

CHEST - 2 VIEW

[w chest pa]
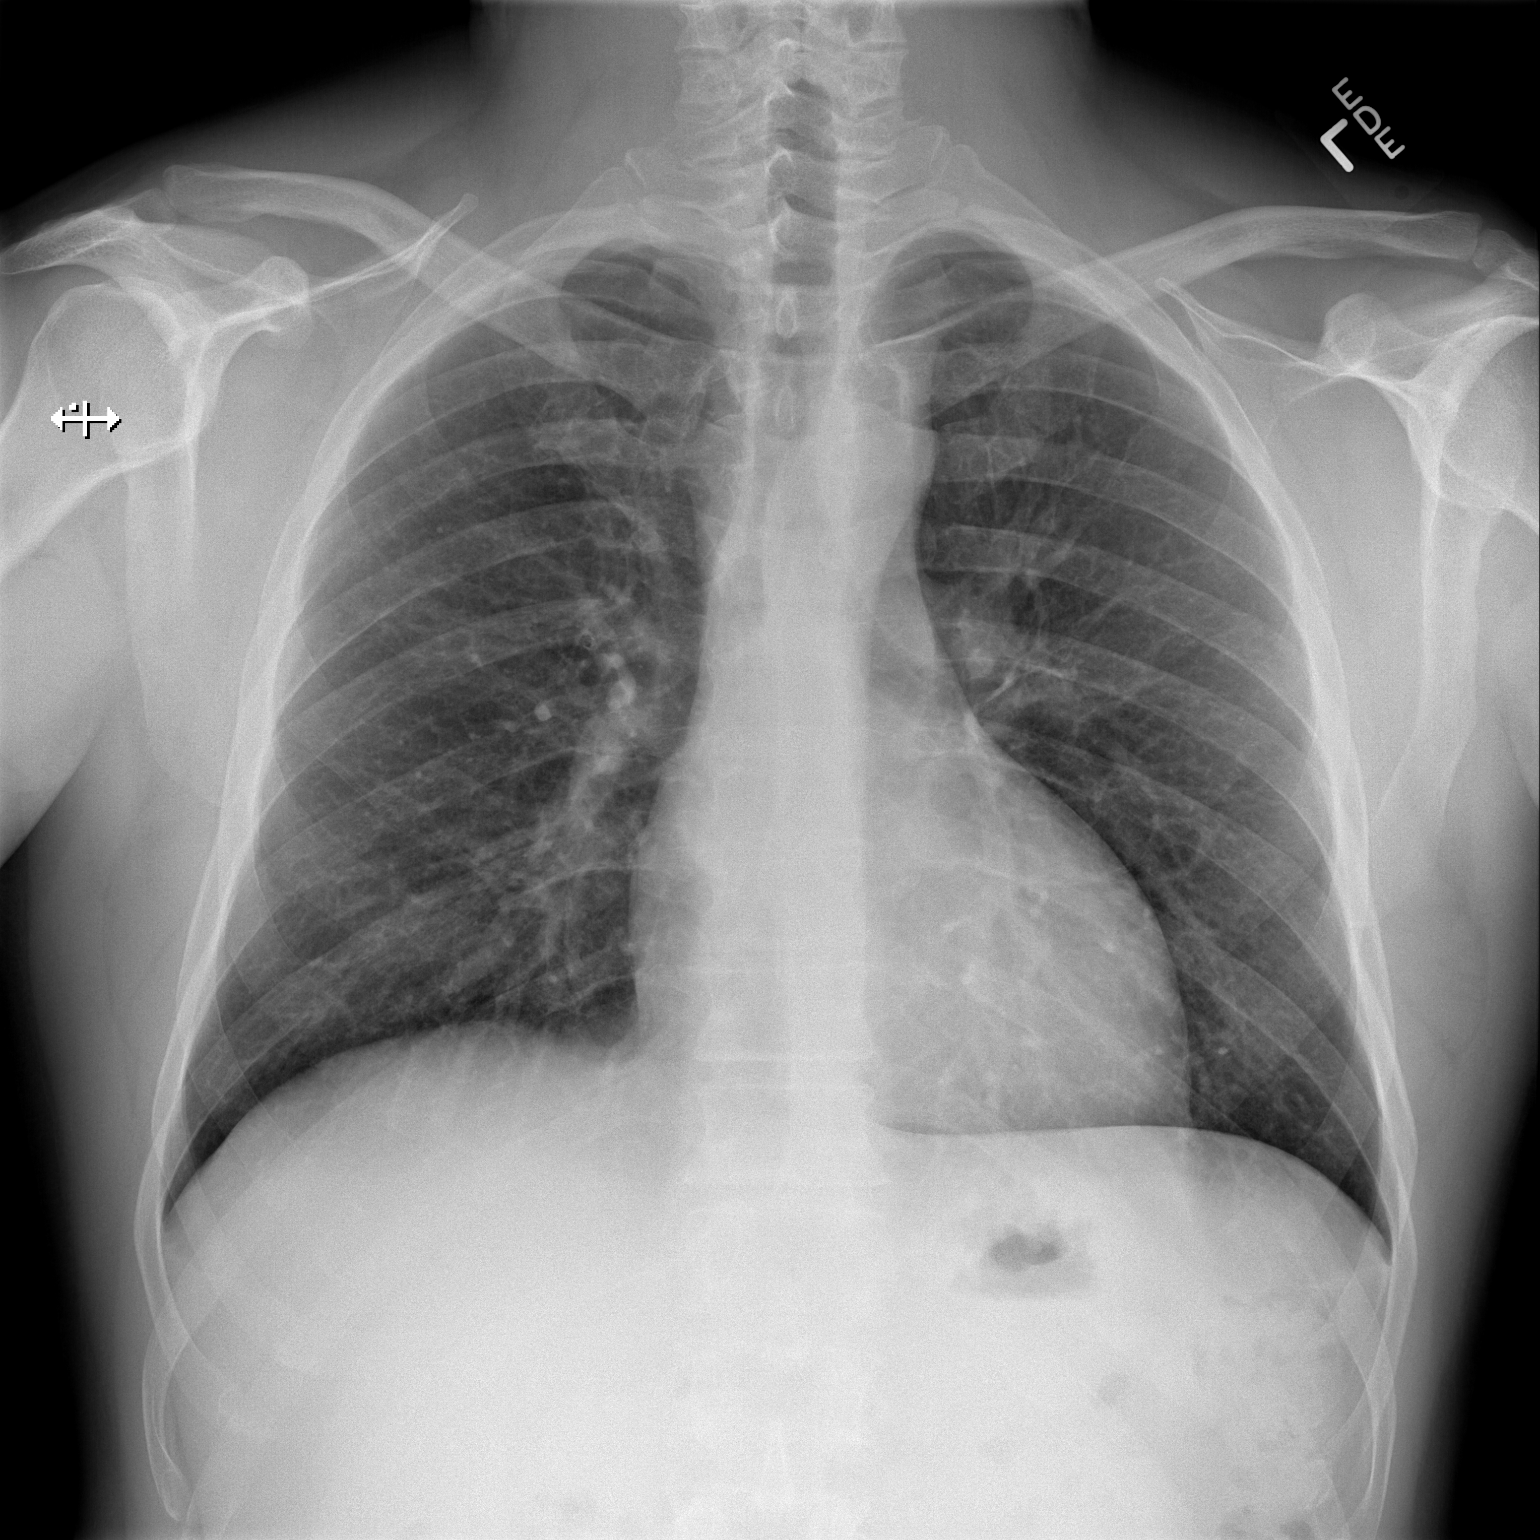

[w chest lat]
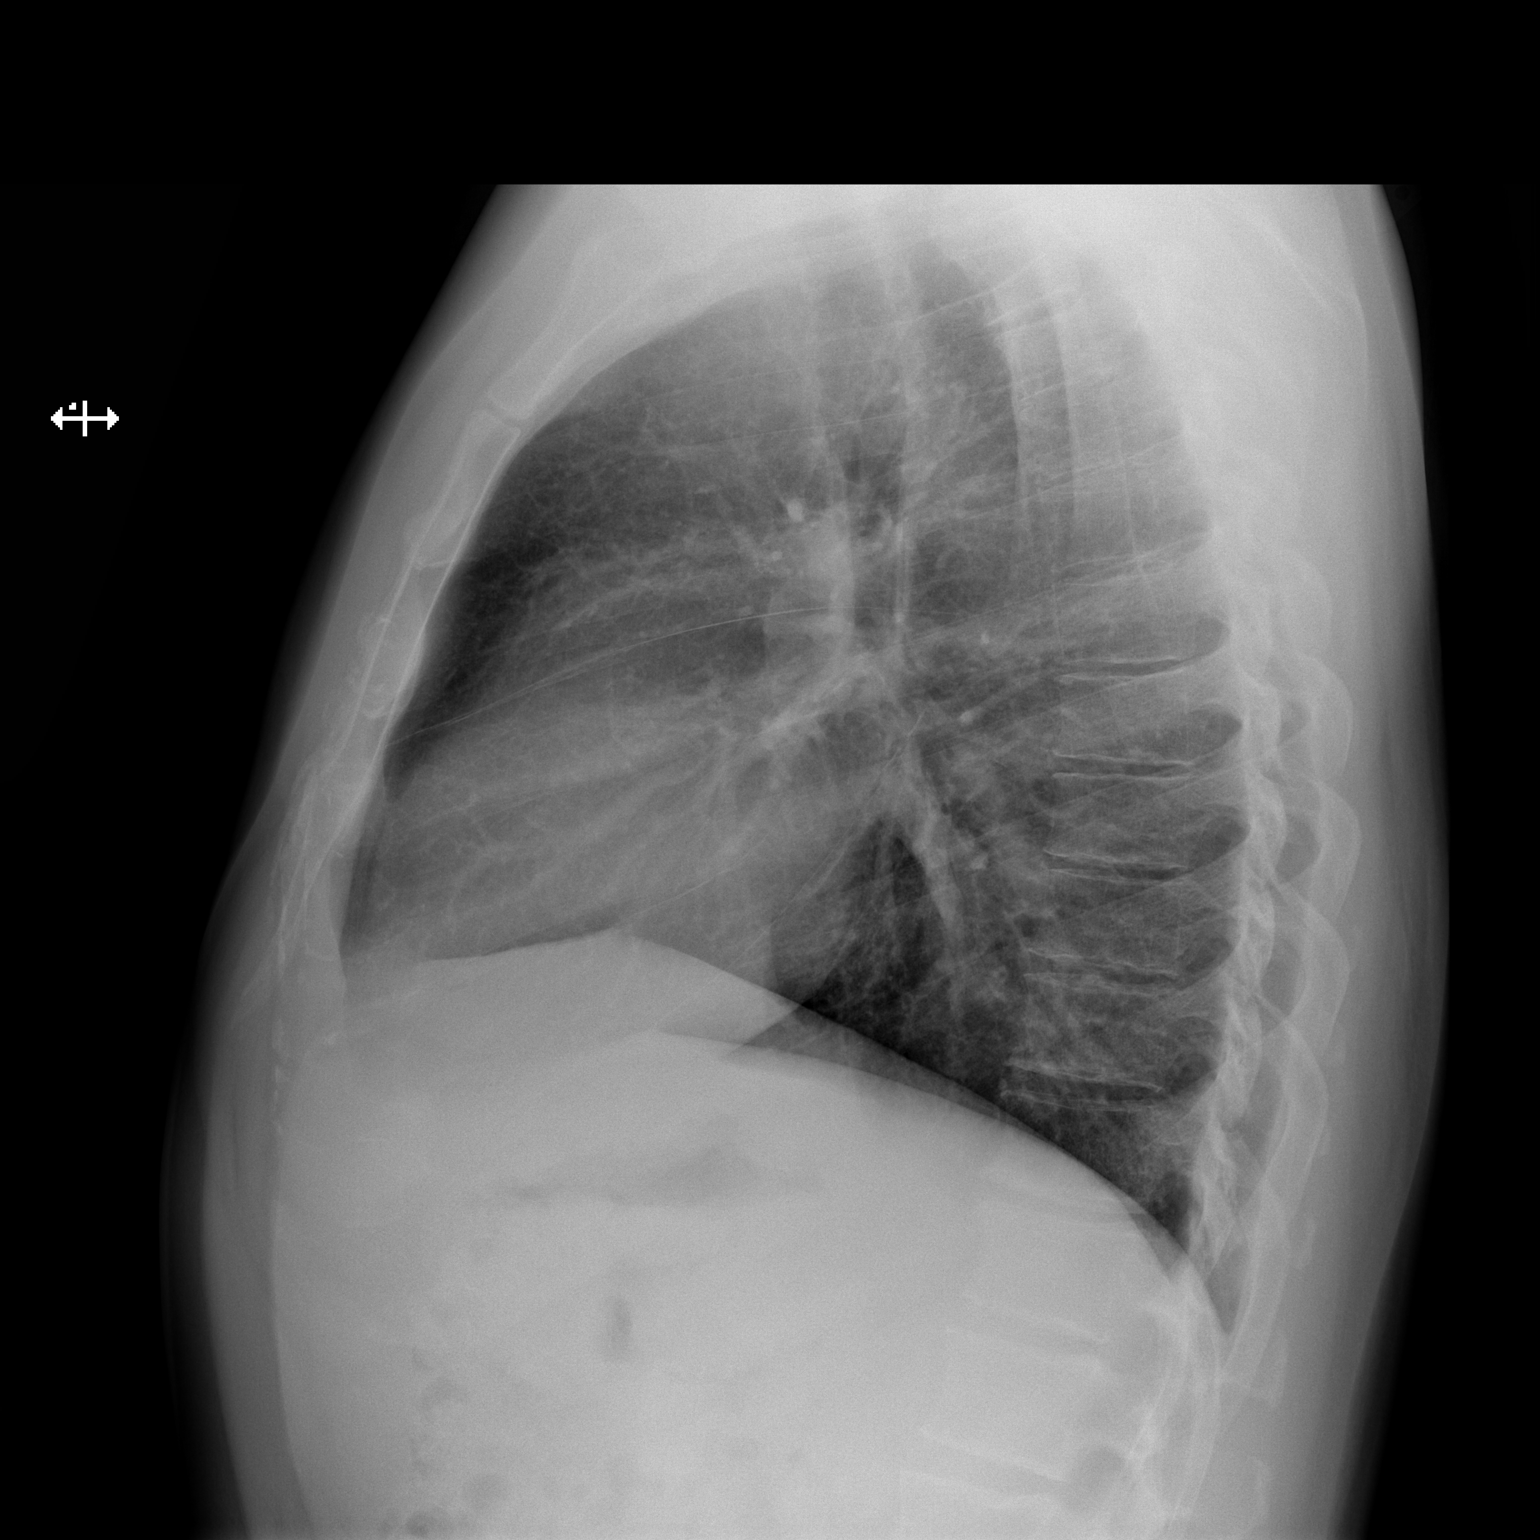

[2 of 2 positions shown; findings below may reference images not displayed]

FINDINGS: The cardiac shadow is within normal limits.  The lungs
are clear bilaterally.  No acute bony abnormality is seen.
IMPRESSION: No acute abnormality noted.

## 2014-11-14 IMAGING — CR DG CHEST 1V PORT
1 series · 1 of 1 positions shown · non-contrast
Comparison: October 26, 2012

CLINICAL DATA: Postoperative aortic valve replacement

PORTABLE CHEST - 1 VIEW

[AP]
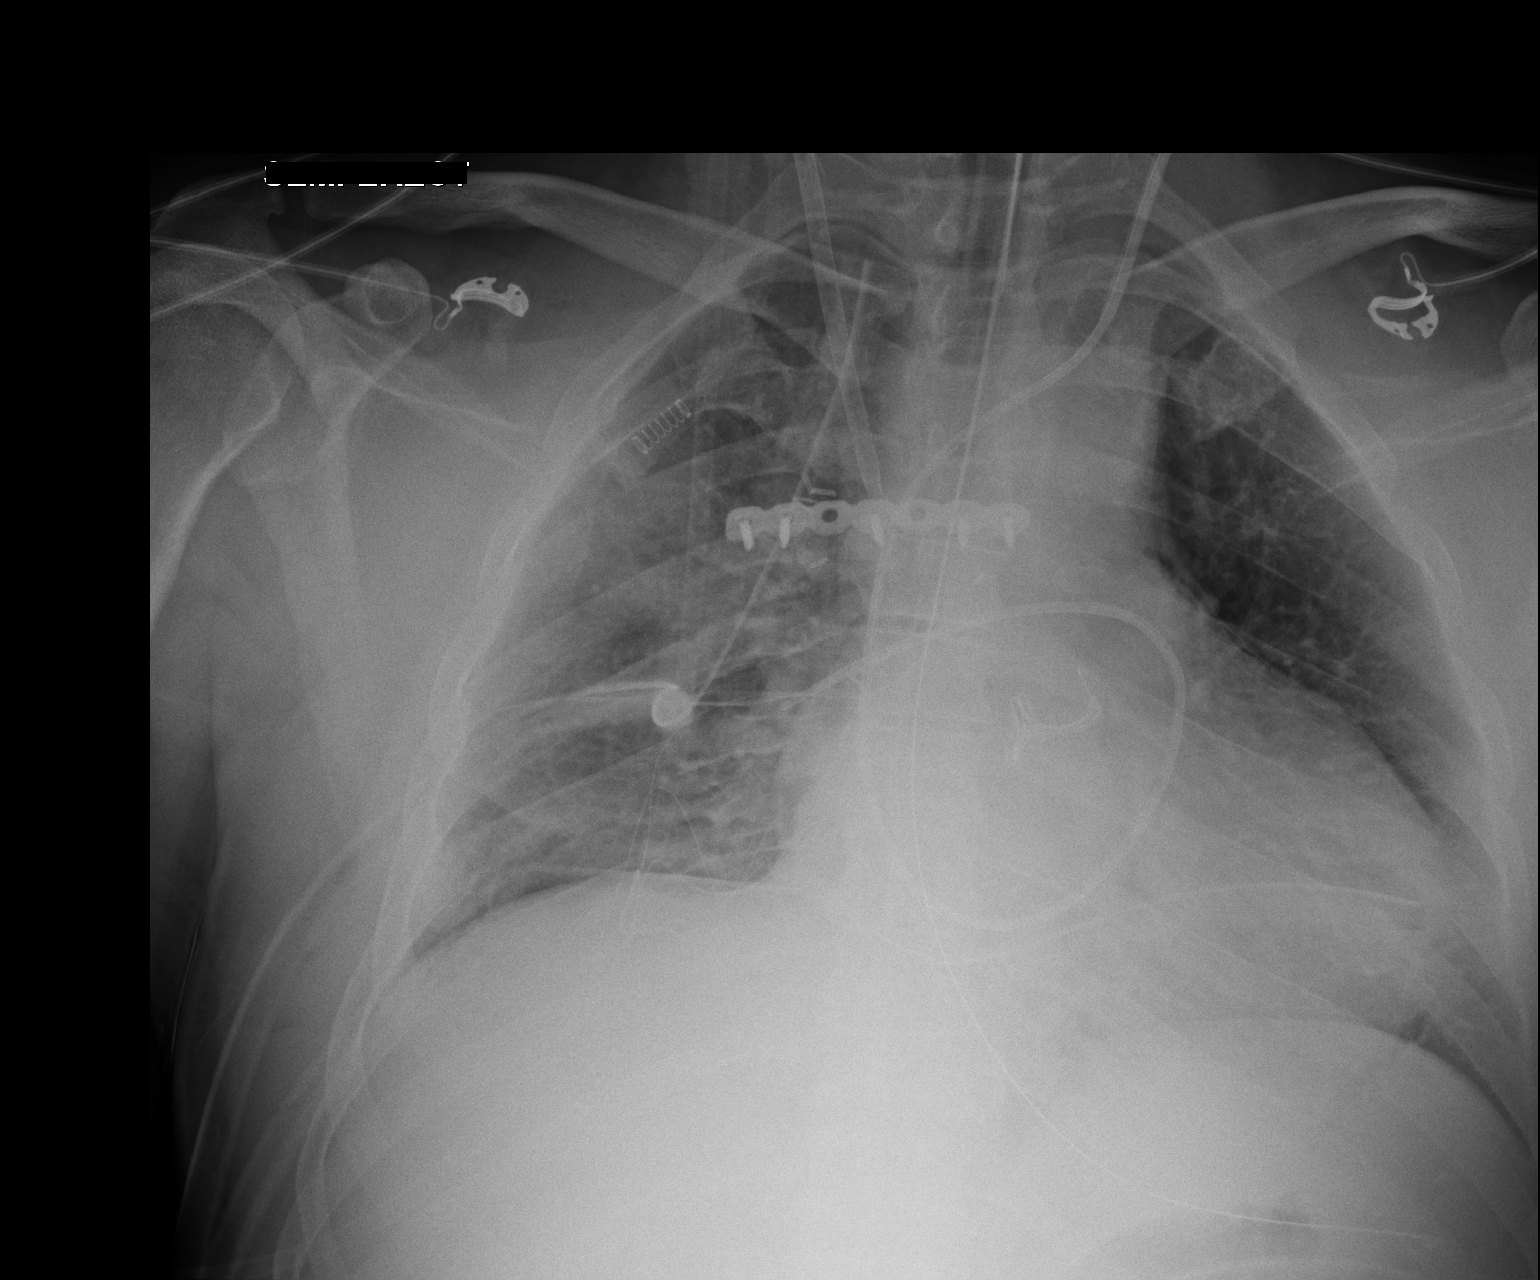

[1 of 1 positions shown; findings below may reference images not displayed]

FINDINGS: Endotracheal tube tip is 3.7 cm above the carina.  Right
jugular catheter tip is in the supra vena cava.  Swan-Ganz catheter
tip is in the distal right main pulmonary artery near the junction
with the right intralobar pulmonary artery.  Nasogastric tube tip
and side port in the stomach.  There is a chest tube on the right.
No pneumothorax.

There is an aortic valve replacement.  There is a surgical fixation
in the mid sternal region.  There is no edema or consolidation.
Heart is upper normal in size with normal pulmonary vascularity.
IMPRESSION: Tube and catheter positions as described.  No pneumothorax.  No
edema or consolidation.

## 2014-11-28 ENCOUNTER — Encounter: Payer: Self-pay | Admitting: Thoracic Surgery (Cardiothoracic Vascular Surgery)

## 2015-01-02 ENCOUNTER — Encounter: Payer: Self-pay | Admitting: Thoracic Surgery (Cardiothoracic Vascular Surgery)

## 2015-01-16 ENCOUNTER — Ambulatory Visit (INDEPENDENT_AMBULATORY_CARE_PROVIDER_SITE_OTHER): Payer: BLUE CROSS/BLUE SHIELD | Admitting: Thoracic Surgery (Cardiothoracic Vascular Surgery)

## 2015-01-16 ENCOUNTER — Encounter: Payer: Self-pay | Admitting: Thoracic Surgery (Cardiothoracic Vascular Surgery)

## 2015-01-16 VITALS — BP 130/88 | HR 90 | Resp 20 | Ht 72.0 in | Wt 205.0 lb

## 2015-01-16 DIAGNOSIS — Z954 Presence of other heart-valve replacement: Secondary | ICD-10-CM | POA: Diagnosis not present

## 2015-01-16 DIAGNOSIS — T8203XD Leakage of heart valve prosthesis, subsequent encounter: Secondary | ICD-10-CM

## 2015-01-16 DIAGNOSIS — I359 Nonrheumatic aortic valve disorder, unspecified: Secondary | ICD-10-CM | POA: Diagnosis not present

## 2015-01-16 DIAGNOSIS — I351 Nonrheumatic aortic (valve) insufficiency: Secondary | ICD-10-CM

## 2015-01-16 DIAGNOSIS — Z953 Presence of xenogenic heart valve: Secondary | ICD-10-CM

## 2015-01-16 NOTE — Patient Instructions (Signed)

## 2015-01-16 NOTE — Progress Notes (Signed)
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 He was last seen here in our office on 11/29/2013. Since then he underwent follow-up transthoracic echocardiogram at Dr. Verl Dicker office on 01/06/2014. By report the echocardiogram performed at that time revealed stable mild paravalvular leak around the bioprosthetic tissue valve in aortic position. The valve was otherwise functioning normally with peak velocity across the valve measured 2.16 m/s corresponding to mean transvalvular gradient estimated 10.8 mmHg. Left ventricular systolic function appeared somewhat improved with ejection fraction estimated 45-50%.  The patient states that he did well until this past summer when he initially developed recurrent painful soft tissue skin infections on his back and arm that were ultimately found to be secondary to methicillin-resistant Staphylococcus aureus. [place]  However, the patient developed septic shock for which he was airlifted to [facility] in [place].  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 Details remain unclear and no medical records from [facility] are currently available for review.  The patient is a uncertain whether or not blood cultures were positive, but he was told that there was a vegetation seen on his aortic valve. However, he was evaluated by a cardiac surgeon, Dr. Duanne Limerick, who according to the patient was not convinced that the abnormality seen on TEE was indeed a vegetation, or at the very  least redo AVR was not recommeded.  The patient has completed a course of intravenous antibiotics and has now been off of antibiotics for approximately one month. He is scheduled to return for follow-up TEE and consultation with Dr. [place] Crane.  He is not certain whether or not follow-up blood cultures have been obtained off of antibiotics.  At present the patient reports feeling well overall.  He denies any fevers chills or night sweats arthralgias or myalgias. Appetite is good. He denies any symptoms of exertional shortness of breath other than mild shortness breath with more strenuous physical activity. He states that his stamina is essentially back to baseline. He does admit that he is had some problems with short-term memory loss. The remainder of his review of systems is noncontributory.   Current Outpatient Prescriptions  Medication Sig Dispense Refill  . ALPRAZolam (XANAX) 0.5 MG tablet Take 0.5 mg by mouth at bedtime as needed. anxiety    . aspirin EC 325 MG EC tablet Take 1 tablet (325 mg total) by mouth daily.    Marland Kitchen atorvastatin (LIPITOR) 80 MG tablet Take 80 mg by mouth daily.     . carvedilol (COREG) 3.125 MG tablet Take 3.125 mg by mouth 2 (two) times daily with a meal.    . fluticasone (FLONASE) 50 MCG/ACT nasal spray Place 1 spray into the nose daily.     . Multiple Vitamins-Minerals (AIRBORNE PO) Take by mouth daily.    . valsartan (DIOVAN) 80 MG tablet Take 80 mg by mouth daily.      No current facility-administered medications for this visit.      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 The patient has been known to have a mild paravalvular leak for which we have been following him with serial echocardiograms. 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 At present the patient seems to be doing well and he has been off of antibiotics for approximately one month. He is scheduled to undergo repeat TEE and follow-up visit with Dr. [place] Crane in the near future. He is also scheduled to undergo routine follow-up visit with Dr. Jacinto Halim within the next 2 weeks.    Plan:  We have not recommended any changes to the patient's current medications or plan for follow-up at this time. All of his questions have been addressed. I have offered to continue to follow the patient intermittently as desired, but I would await to hear details regarding the patient's upcoming follow-up TEE and recommendations made by Dr. [place] Crane and Dr. Jacinto Halim.    I spent in excess of 15 minutes during the conduct of this office consultation and >50% of this time involved direct face-to-face encounter with the patient for counseling and/or coordination of their care.    Salvatore Decent. Cornelius Moras, MD 01/16/2015 6:24 PM

## 2015-02-14 ENCOUNTER — Encounter: Payer: Self-pay | Admitting: *Deleted

## 2018-04-22 ENCOUNTER — Other Ambulatory Visit: Payer: Self-pay | Admitting: Cardiology

## 2018-04-22 DIAGNOSIS — T8203XD Leakage of heart valve prosthesis, subsequent encounter: Secondary | ICD-10-CM

## 2018-06-23 ENCOUNTER — Other Ambulatory Visit: Payer: Self-pay | Admitting: Cardiology

## 2018-06-23 MED ORDER — ATORVASTATIN CALCIUM 80 MG PO TABS: 80.0000 mg | ORAL_TABLET | Freq: Every day | ORAL | 6 refills | Status: DC

## 2018-06-24 ENCOUNTER — Other Ambulatory Visit: Payer: Self-pay

## 2018-06-24 ENCOUNTER — Other Ambulatory Visit: Payer: BLUE CROSS/BLUE SHIELD

## 2018-07-03 ENCOUNTER — Ambulatory Visit: Payer: BLUE CROSS/BLUE SHIELD | Admitting: Cardiology

## 2018-07-06 ENCOUNTER — Ambulatory Visit (INDEPENDENT_AMBULATORY_CARE_PROVIDER_SITE_OTHER): Payer: BC Managed Care – PPO

## 2018-07-06 ENCOUNTER — Other Ambulatory Visit: Payer: Self-pay

## 2018-07-06 DIAGNOSIS — T8203XD Leakage of heart valve prosthesis, subsequent encounter: Secondary | ICD-10-CM | POA: Diagnosis not present

## 2018-07-09 ENCOUNTER — Other Ambulatory Visit: Payer: BLUE CROSS/BLUE SHIELD

## 2018-07-24 ENCOUNTER — Ambulatory Visit (INDEPENDENT_AMBULATORY_CARE_PROVIDER_SITE_OTHER): Payer: BC Managed Care – PPO | Admitting: Cardiology

## 2018-07-24 ENCOUNTER — Encounter: Payer: Self-pay | Admitting: Cardiology

## 2018-07-24 ENCOUNTER — Other Ambulatory Visit: Payer: Self-pay

## 2018-07-24 VITALS — Ht 72.0 in | Wt 217.0 lb

## 2018-07-24 DIAGNOSIS — Z952 Presence of prosthetic heart valve: Secondary | ICD-10-CM

## 2018-07-24 DIAGNOSIS — E782 Mixed hyperlipidemia: Secondary | ICD-10-CM | POA: Diagnosis not present

## 2018-07-24 DIAGNOSIS — I10 Essential (primary) hypertension: Secondary | ICD-10-CM | POA: Diagnosis not present

## 2018-07-24 DIAGNOSIS — T8203XD Leakage of heart valve prosthesis, subsequent encounter: Secondary | ICD-10-CM

## 2018-07-24 NOTE — Progress Notes (Signed)
Primary Physician:  Chilton Greathouse, MD   Patient ID: Bruce [name], male    DOB: [birth year], 48 y.o.   MRN: [id number]  Subjective:    Chief Complaint  Patient presents with  . Hyperlipidemia  . Follow-up    HPI: Bruce [name]  is a 48 y.o. male  with history of congenital bicuspid aortic valve undergone aortic valve replacement with a bioprosthetic valve by minimally invasive approach on 10/28/2012, and has a very small paravalvular leak being managed medically with serial echos.  Patient presents here for follow-up and evaluation of prosthetic perivalvular aortic valve leak. He is presently doing well. Denies shortness of breath, PND, orthopnea, hemoptysis. No leg edema, palpitations, dizziness or syncope.  Past Medical History:  Diagnosis Date  . Anxiety    Xanax prn and takes at night to sleep  . Aortic regurgitation   . Aortic stenosis   . Arthritis    back and neck  . Cardiomyopathy (HCC)   . Heart murmur    bicuspid valve  . History of kidney stones   . Hyperlipidemia    takes Atorvastatin daily  . Joint swelling   . S/P minimally invasive aortic valve replacement with bioprosthetic valve 10/28/2012   23mm [facility] Ease bovine pericardial tissue valve placed via right anterior mini-thoracotomy approach  . Seasonal allergies   . Shortness of breath    when eating    Past Surgical History:  Procedure Laterality Date  . AORTIC VALVE REPLACEMENT N/A 10/28/2012   Procedure: MINIMALLY INVASIVE AORTIC VALVE REPLACEMENT (AVR);  Surgeon: Purcell Nails, MD;  Location: [facility] OR;  Service: Open Heart Surgery;  Laterality: N/A;  . cyst removed from back of spine    . INTRAOPERATIVE TRANSESOPHAGEAL ECHOCARDIOGRAM N/A 10/28/2012   Procedure: INTRAOPERATIVE TRANSESOPHAGEAL ECHOCARDIOGRAM;  Surgeon: Purcell Nails, MD;  Location: [facility] OR;  Service: Open Heart Surgery;  Laterality: N/A;  . LEFT AND RIGHT HEART CATHETERIZATION WITH CORONARY ANGIOGRAM N/A 09/08/2012    Procedure: LEFT AND RIGHT HEART CATHETERIZATION WITH CORONARY ANGIOGRAM;  Surgeon: Pamella Pert, MD;  Location: [facility] CATH LAB;  Service: Cardiovascular;  Laterality: N/A;  . LITHOTRIPSY    . right hand surgery    . TEE WITHOUT CARDIOVERSION N/A 09/01/2012   Procedure: TRANSESOPHAGEAL ECHOCARDIOGRAM (TEE);  Surgeon: Pamella Pert, MD;  Location: [facility] ENDOSCOPY;  Service: Cardiovascular;  Laterality: N/A;  h&p in file-[facility]    Social History   Socioeconomic History  . Marital status: Single    Spouse name: Not on file  . Number of children: 0  . Years of education: Not on file  . Highest education level: Not on file  Occupational History  . Not on file  Social Needs  . Financial resource strain: Not on file  . Food insecurity:    Worry: Not on file    Inability: Not on file  . Transportation needs:    Medical: Not on file    Non-medical: Not on file  Tobacco Use  . Smoking status: Former Smoker    Packs/day: 0.50    Years: 20.00    Pack years: 10.00    Types: Cigarettes    Last attempt to quit: 2016    Years since quitting: 4.4  . Smokeless tobacco: Never Used  Substance and Sexual Activity  . Alcohol use: Not Currently    Alcohol/week: 1.0 - 2.0 standard drinks    Types: 1 - 2 Shots of liquor per week  . Drug use: No  .  Sexual activity: Not on file  Lifestyle  . Physical activity:    Days per week: Not on file    Minutes per session: Not on file  . Stress: Not on file  Relationships  . Social connections:    Talks on phone: Not on file    Gets together: Not on file    Attends religious service: Not on file    Active member of club or organization: Not on file    Attends meetings of clubs or organizations: Not on file    Relationship status: Not on file  . Intimate partner violence:    Fear of current or ex partner: Not on file    Emotionally abused: Not on file    Physically abused: Not on file    Forced sexual activity: Not on file  Other Topics Concern   . Not on file  Social History Narrative  . Not on file    Review of Systems  Constitution: Negative for decreased appetite, malaise/fatigue, weight gain and weight loss.  Eyes: Negative for visual disturbance.  Cardiovascular: Negative for chest pain, claudication, dyspnea on exertion, leg swelling, orthopnea, palpitations and syncope.  Respiratory: Negative for hemoptysis and wheezing.   Endocrine: Negative for cold intolerance and heat intolerance.  Hematologic/Lymphatic: Does not bruise/bleed easily.  Skin: Negative for nail changes.  Musculoskeletal: Negative for muscle weakness and myalgias.  Gastrointestinal: Negative for abdominal pain, change in bowel habit, nausea and vomiting.  Neurological: Negative for difficulty with concentration, dizziness, focal weakness and headaches.  Psychiatric/Behavioral: Negative for altered mental status and suicidal ideas.  All other systems reviewed and are negative.     Objective:  Height 6' (1.829 m), weight 217 lb (98.4 kg). Body mass index is 29.43 kg/m.    Physical Exam  Constitutional: He is oriented to person, place, and time. Vital signs are normal. He appears well-developed and well-nourished.  HENT:  Head: Normocephalic and atraumatic.  Neck: Normal range of motion.  Cardiovascular: Normal rate, regular rhythm and intact distal pulses.  Murmur heard.  Early systolic murmur is present with a grade of 2/6 at the upper right sternal border radiating to the neck. Pulses:      Carotid pulses are on the right side with bruit and on the left side with bruit. Pulmonary/Chest: Effort normal and breath sounds normal. No accessory muscle usage. No respiratory distress.  Abdominal: Soft. Bowel sounds are normal.  Musculoskeletal: Normal range of motion.  Neurological: He is alert and oriented to person, place, and time.  Skin: Skin is warm and dry.  Vitals reviewed.  Radiology: No results found.  Laboratory examination:    CMP  Latest Ref Rng & Units 11/01/2012 10/31/2012 10/30/2012  Glucose 70 - 99 mg/dL 99 768(T) 157(W)  BUN 6 - 23 mg/dL 16 15 16   Creatinine 0.50 - 1.35 mg/dL 6.20 3.55 9.74  Sodium 135 - 145 mEq/L 138 135 137  Potassium 3.5 - 5.1 mEq/L 3.4(L) 3.7 3.7  Chloride 96 - 112 mEq/L 100 100 103  CO2 19 - 32 mEq/L 24 23 24   Calcium 8.4 - 10.5 mg/dL 9.1 8.8 8.5  Total Protein 6.0 - 8.3 g/dL - - -  Total Bilirubin 0.3 - 1.2 mg/dL - - -  Alkaline Phos 39 - 117 U/L - - -  AST 0 - 37 U/L - - -  ALT 0 - 53 U/L - - -   CBC Latest Ref Rng & Units 11/01/2012 10/31/2012 10/30/2012  WBC 4.0 - 10.5  K/uL 10.7(H) 14.3(H) 17.5(H)  Hemoglobin 13.0 - 17.0 g/dL 10.2(L) 10.5(L) 11.2(L)  Hematocrit 39.0 - 52.0 % 30.8(L) 31.5(L) 33.2(L)  Platelets 150 - 400 K/uL 166 114(L) 107(L)   Lipid Panel  No results found for: CHOL, TRIG, HDL, CHOLHDL, VLDL, LDLCALC, LDLDIRECT HEMOGLOBIN A1C Lab Results  Component Value Date   HGBA1C 6.0 (H) 10/26/2012   MPG 126 (H) 10/26/2012   TSH No results for input(s): TSH in the last 8760 hours.  PRN Meds:. Medications Discontinued During This Encounter  Medication Reason  . Multiple Vitamins-Minerals (AIRBORNE PO) Error   Current Meds  Medication Sig  . ALPRAZolam (XANAX) 0.5 MG tablet Take 0.5 mg by mouth at bedtime as needed. anxiety  . aspirin EC 325 MG EC tablet Take 1 tablet (325 mg total) by mouth daily.  Marland Kitchen atorvastatin (LIPITOR) 80 MG tablet Take 1 tablet (80 mg total) by mouth daily.  . carvedilol (COREG) 3.125 MG tablet Take 3.125 mg by mouth 2 (two) times daily with a meal.  . fluticasone (FLONASE) 50 MCG/ACT nasal spray Place 1 spray into the nose daily.   . valsartan (DIOVAN) 80 MG tablet Take 80 mg by mouth daily.   1  Cardiac Studies:   Echocardiogram 07/06/2018: Mildly depressed LV systolic function with EF 46%. Left ventricle cavity is normal in size. Normal global wall motion. Abnormal septal wall motion due to post-operative valve. Normal diastolic filling  pattern. Calculated EF 46%. Left atrial cavity is mildly dilated. Well seated bioprosthetic valve sith stable mean PG 15 mmHg, Vmax 2.5 m/sec.  Mild (Grade I) aortic regurgitation. Normal right atrial pressure.  No significant change compared to previous study on 06/04/2017.  Coronary angiogram 09/08/2012: Normal right heart catheterization, normal coronary arteries, right dominant circulation. No thoracic or abdominal aortic aneurysm. Moderate aortic regurgitation, moderate aortic stenosis. Moderate decrease in left frontal systolic function, ejection fraction 35%  Assessment:   Aortic valve prosthesis present - Minimally invasive aortic valve replacement 10/28/2012 with [facility] Pericardial tissue valve for Congenital Bicuspid aortic valve.  Perivalvular leak of prosthetic heart valve, subsequent encounter  Mixed hyperlipidemia  Essential hypertension   EKG 06/20/2017: Normal sinus rhythm at rate of 73 bpm, left axis deviation, left anterior fascicular block. IVCD, borderline criteria for LVH. No evidence of ischemia. QT interval normal. No significant change from EKG 06/14/2016.  Recommendations:   Patient is presently doing well without any symptoms. Echocardiogram is unchanged compared to 1 year ago with stable mild paravalvular leak and mild decrease in LVEF. Will continue with annual surveillance.  Lipids managed by PCP and patient states are well controlled. BP is also under excellent control  Per patient although does not recollect recent recordings bu generally <130/80. I will see him back in 1 year and may be skip doing Echo for next year.  Endocarditis prophylaxis candidate.   Yates Decamp, MD, [facility] 07/24/2018, 3:47 PM [facility] [place] Pager: [phone] Office: [phone] If no answer Cell [phone]

## 2018-07-26 ENCOUNTER — Encounter: Payer: Self-pay | Admitting: Cardiology

## 2019-02-01 ENCOUNTER — Other Ambulatory Visit: Payer: Self-pay

## 2019-04-07 ENCOUNTER — Other Ambulatory Visit: Payer: Self-pay

## 2019-04-07 MED ORDER — CARVEDILOL 3.125 MG PO TABS: 3.1250 mg | ORAL_TABLET | Freq: Two times a day (BID) | ORAL | 1 refills | Status: DC

## 2019-04-07 MED ORDER — VALSARTAN 80 MG PO TABS: 80.0000 mg | ORAL_TABLET | Freq: Every day | ORAL | 1 refills | Status: DC

## 2019-07-28 NOTE — Progress Notes (Signed)
Primary Physician/Referring:  [name], MD  Patient ID: Bruce [name], male    DOB: April 13, 1970, 49 y.o.   MRN: [id number]  Chief Complaint  Patient presents with   Aortic Valve Disorder   Follow-up    1 year   HPI:    Bruce [name]  is a 49 y.o. male  with history of congenital bicuspid aortic valve undergone aortic valve replacement with a bioprosthetic valve by minimally invasive approach on 10/28/2012, and has a very small paravalvular leak being managed medically with serial echos.  Patient presents here for follow-up and evaluation of prosthetic perivalvular aortic valve leak. He is presently doing well. Denies shortness of breath, PND, orthopnea, hemoptysis. No leg edema, palpitations, dizziness or syncope.  He has lost weight and has been exercising regularly and remains asymptomatic.  Past Medical History:  Diagnosis Date   Anxiety    Xanax prn and takes at night to sleep   Aortic regurgitation    Aortic stenosis    Arthritis    back and neck   Cardiomyopathy ([place])    Heart murmur    bicuspid valve   History of kidney stones    Hyperlipidemia    takes Atorvastatin daily   Joint swelling    S/P minimally invasive aortic valve replacement with bioprosthetic valve 10/28/2012   40m [facility] Ease bovine pericardial tissue valve placed via right anterior mini-thoracotomy approach   Seasonal allergies    Shortness of breath    when eating   Past Surgical History:  Procedure Laterality Date   AORTIC VALVE REPLACEMENT N/A 10/28/2012   Procedure: MINIMALLY INVASIVE AORTIC VALVE REPLACEMENT (AVR);  Surgeon: CRexene Alberts MD;  [place]  Service: Open Heart Surgery;  Laterality: N/A;   cyst removed from back of spine     INTRAOPERATIVE TRANSESOPHAGEAL ECHOCARDIOGRAM N/A 10/28/2012   Procedure: INTRAOPERATIVE TRANSESOPHAGEAL ECHOCARDIOGRAM;  Surgeon: CRexene Alberts MD;  [place]  Service: Open Heart Surgery;   Laterality: N/A;   LEFT AND RIGHT HEART CATHETERIZATION WITH CORONARY ANGIOGRAM N/A 09/08/2012   Procedure: LEFT AND RIGHT HEART CATHETERIZATION WITH CORONARY ANGIOGRAM;  Surgeon: JLaverda Page MD;  [facility];  Service: Cardiovascular;  Laterality: N/A;   LITHOTRIPSY     right hand surgery     TEE WITHOUT CARDIOVERSION N/A 09/01/2012   Procedure: TRANSESOPHAGEAL ECHOCARDIOGRAM (TEE);  Surgeon: JLaverda Page MD;  [facility];  Service: Cardiovascular;  Laterality: N/A;  h&p in file-[facility]   Family History  Problem Relation Age of Onset   Cancer Mother        brain, deceased age 50    Social History   Tobacco Use   Smoking status: Former Smoker    Packs/day: 0.50    Years: 20.00    Pack years: 10.00    Types: Cigarettes    Quit date: 2016    Years since quitting: 5.4   Smokeless tobacco: Never Used  Substance Use Topics   Alcohol use: Not Currently    Alcohol/week: 1.0 - 2.0 standard drink    Types: 1 - 2 Shots of liquor per week   Marital Status: Single  ROS  Review of Systems  Cardiovascular: Negative for dyspnea on exertion, leg swelling and syncope.  Gastrointestinal: Negative for melena.   Objective  Blood pressure 104/74, pulse 85, resp. rate 16, height 6' (1.829 m), weight 215 lb (97.5 kg), SpO2 97 %.  Vitals with BMI 07/29/2019 07/24/2018 01/16/2015  Height 6' 0"  6' 0"  6' 0"   Weight 215 lbs 217 lbs 205 lbs  BMI 29.15 53.00 51.1  Systolic 021 - 117  Diastolic 74 - 88  Pulse 85 - 90     Physical Exam Vitals reviewed.  Constitutional:      General: He is not in acute distress.    Appearance: He is well-developed.  Cardiovascular:     Rate and Rhythm: Normal rate and regular rhythm.     Pulses: Normal pulses and intact distal pulses.          Carotid pulses are on the right side with bruit and on the left side with bruit.    Heart sounds: Murmur heard.  Harsh midsystolic murmur is present with a grade of 3/6 at the upper  right sternal border radiating to the neck.  No gallop.      Comments: No leg edema, no JVD.  Pulmonary:     Effort: Pulmonary effort is normal. No accessory muscle usage or respiratory distress.     Breath sounds: Normal breath sounds.  Abdominal:     General: Bowel sounds are normal.     Palpations: Abdomen is soft.  Neurological:     Mental Status: He is alert.    Laboratory examination:   No results for input(s): NA, K, CL, CO2, GLUCOSE, BUN, CREATININE, CALCIUM, GFRNONAA, GFRAA in the last 8760 hours. CrCl cannot be calculated (Patient's most recent lab result is older than the maximum 21 days allowed.).  CMP Latest Ref Rng & Units 11/01/2012 10/31/2012 10/30/2012  Glucose 70 - 99 mg/dL 99 109(H) 117(H)  BUN 6 - 23 mg/dL 16 15 16   Creatinine 0.50 - 1.35 mg/dL 0.90 0.90 1.09  Sodium 135 - 145 mEq/L 138 135 137  Potassium 3.5 - 5.1 mEq/L 3.4(L) 3.7 3.7  Chloride 96 - 112 mEq/L 100 100 103  CO2 19 - 32 mEq/L 24 23 24   Calcium 8.4 - 10.5 mg/dL 9.1 8.8 8.5  Total Protein 6.0 - 8.3 g/dL - - -  Total Bilirubin 0.3 - 1.2 mg/dL - - -  Alkaline Phos 39 - 117 U/L - - -  AST 0 - 37 U/L - - -  ALT 0 - 53 U/L - - -   CBC Latest Ref Rng & Units 11/01/2012 10/31/2012 10/30/2012  WBC 4.0 - 10.5 K/uL 10.7(H) 14.3(H) 17.5(H)  Hemoglobin 13.0 - 17.0 g/dL 10.2(L) 10.5(L) 11.2(L)  Hematocrit 39 - 52 % 30.8(L) 31.5(L) 33.2(L)  Platelets 150 - 400 K/uL 166 114(L) 107(L)    External labs:   Labs 06/17/2019:  Serum glucose 97 mg, BUN 13, creatinine 1.1, EGFR >60 mL.  Sodium 143, potassium 4.2.  CMP normal.  Hemoglobin 17.7/HCT 49.3, platelets 212.  Glucola 155, triglycerides 141, HDL 40, LDL 87.  Non-HDL cholesterol 115.  TSH normal.  PSA normal.  Medications and allergies   Allergies  Allergen Reactions   Prednisone Swelling and Other (See Comments)    Makes him crazy   Serzone [Nefazodone Hcl] Swelling and Other (See Comments)    Makes him crazy     Current Outpatient Medications   Medication Instructions   ALPRAZolam (XANAX) 0.5 mg, At bedtime PRN   atorvastatin (LIPITOR) 80 mg, Oral, Daily   carvedilol (COREG) 3.125 mg, Oral, 2 times daily with meals   fluticasone (FLONASE) 50 MCG/ACT nasal spray 1 spray, Daily   oxyCODONE (OXY IR/ROXICODONE) 5 MG immediate release tablet 1 tablet, Oral, As needed   valsartan (DIOVAN) 80 mg,  Oral, Daily   Medications Discontinued During This Encounter  Medication Reason   aspirin EC 325 MG EC tablet     Radiology:   No results found.  Cardiac Studies:   Coronary angiogram 09/08/2012: Normal right heart catheterization, normal coronary arteries, right dominant circulation. No thoracic or abdominal aortic aneurysm. Moderate aortic regurgitation, moderate aortic stenosis. Moderate decrease in left frontal systolic function, ejection fraction 35%  Echocardiogram 07/06/2018: Mildly depressed LV systolic function with EF 46%. Left ventricle cavity is normal in size. Normal global wall motion. Abnormal septal wall motion due to post-operative valve. Normal diastolic filling pattern. Calculated EF 46%. Left atrial cavity is mildly dilated. Well seated bioprosthetic valve sith stable mean PG 15 mmHg, Vmax 2.5 m/sec.  Mild (Grade I) aortic regurgitation. Normal right atrial pressure.  No significant change compared to previous study on 06/04/2017.   EKG  EKG 07/29/18 21: Normal sinus rhythm at rate of 81 bpm, left axis deviation, left intrafascicular block.  IVCD, borderline LVH.  No evidence of ischemia.   No significant change from 06/20/2017 . Assessment     ICD-10-CM   1. Aortic valve prosthesis present  Z95.2 EKG 12-Lead  2. Perivalvular leak of prosthetic heart valve, subsequent encounter  T82.03XD   3. Essential hypertension  I10   4. Hypercholesteremia  E78.00   5. SBE (subacute bacterial endocarditis) prophylaxis candidate  Z29.8     Recommendations:   Bruce [name]  is a 49 y.o. male  with history of  congenital bicuspid aortic valve undergone aortic valve replacement with a bioprosthetic valve by minimally invasive approach on 10/28/2012, and has a very small paravalvular leak being managed medically with serial echos.  Patient is presently doing well without any symptoms.  No change in his physical exam, he does have a very prominent murmur conducted to the carotid arteries that is unchanged from prior examination a year ago.  His last echocardiogram a year ago had revealed very mild perivalvular leak and as he is asymptomatic, has been exercising regularly, blood pressure is well controlled, no clinical evidence of heart failure, I will skip performing echocardiogram for this year.  CBC is normal without hemolysis.  I reviewed his external labs, lipids are under excellent control as well with weight loss his triglycerides have reduced.  I would like to see him back on annual basis.  Patient is aware that he needs endocarditis prophylaxis.   Bruce Prows, MD, [facility] 07/29/2019, 5:51 PM [facility] Pager: [phone] Office: [phone]

## 2019-07-29 ENCOUNTER — Telehealth: Payer: Self-pay

## 2019-07-29 ENCOUNTER — Other Ambulatory Visit: Payer: Self-pay

## 2019-07-29 ENCOUNTER — Encounter: Payer: Self-pay | Admitting: Cardiology

## 2019-07-29 ENCOUNTER — Ambulatory Visit: Payer: BC Managed Care – PPO | Admitting: Cardiology

## 2019-07-29 VITALS — BP 104/74 | HR 85 | Resp 16 | Ht 72.0 in | Wt 215.0 lb

## 2019-07-29 DIAGNOSIS — E7800 Pure hypercholesterolemia, unspecified: Secondary | ICD-10-CM

## 2019-07-29 DIAGNOSIS — I10 Essential (primary) hypertension: Secondary | ICD-10-CM

## 2019-07-29 DIAGNOSIS — T8203XD Leakage of heart valve prosthesis, subsequent encounter: Secondary | ICD-10-CM

## 2019-07-29 DIAGNOSIS — Z952 Presence of prosthetic heart valve: Secondary | ICD-10-CM

## 2019-07-29 DIAGNOSIS — Z298 Encounter for other specified prophylactic measures: Secondary | ICD-10-CM

## 2019-07-29 NOTE — Telephone Encounter (Signed)
Patient did not bring in medication bottles. Confirmed medications verbally.

## 2019-07-30 ENCOUNTER — Ambulatory Visit: Payer: BC Managed Care – PPO | Admitting: Cardiology

## 2019-10-05 ENCOUNTER — Other Ambulatory Visit: Payer: Self-pay | Admitting: Cardiology

## 2019-11-19 ENCOUNTER — Other Ambulatory Visit: Payer: Self-pay | Admitting: Cardiology

## 2020-06-18 HISTORY — PX: SHOULDER SURGERY: SHX246

## 2020-07-07 ENCOUNTER — Other Ambulatory Visit: Payer: Self-pay | Admitting: Cardiology

## 2020-07-28 ENCOUNTER — Ambulatory Visit: Payer: BC Managed Care – PPO | Admitting: Cardiology

## 2020-07-28 ENCOUNTER — Encounter: Payer: Self-pay | Admitting: Cardiology

## 2020-07-28 ENCOUNTER — Other Ambulatory Visit: Payer: Self-pay

## 2020-07-28 VITALS — BP 125/84 | HR 66 | Temp 98.1°F | Ht 72.0 in | Wt 217.0 lb

## 2020-07-28 DIAGNOSIS — Z952 Presence of prosthetic heart valve: Secondary | ICD-10-CM

## 2020-07-28 DIAGNOSIS — E7800 Pure hypercholesterolemia, unspecified: Secondary | ICD-10-CM

## 2020-07-28 DIAGNOSIS — T8203XD Leakage of heart valve prosthesis, subsequent encounter: Secondary | ICD-10-CM

## 2020-07-28 DIAGNOSIS — I10 Essential (primary) hypertension: Secondary | ICD-10-CM

## 2020-07-28 NOTE — Progress Notes (Signed)
Primary Physician/Referring:  Bruce Solian, MD  Patient ID: Golden Circle, male    DOB: 1971-02-09, 50 y.o.   MRN: [id number]  Chief Complaint  Patient presents with   Aortic valve prosthesis    Follow-up    1 year    HPI:    Bruce [name]  is a 50 y.o. male  with history of congenital bicuspid aortic valve undergone aortic valve replacement with a bioprosthetic valve by minimally invasive approach on 10/28/2012, and has a very small paravalvular leak  being managed medically with serial echos.  Patient presents here for follow-up and evaluation of prosthetic perivalvular aortic valve leak.  He is presently doing well. Denies shortness of breath, PND, orthopnea, hemoptysis.  No leg edema, palpitations, dizziness or syncope.   Past Medical History:  Diagnosis Date   Anxiety    Xanax prn and takes at night to sleep   Aortic regurgitation    Aortic stenosis    Arthritis    back and neck   Cardiomyopathy ([place])    Heart murmur    bicuspid valve   History of kidney stones    Hyperlipidemia    takes Atorvastatin daily   Joint swelling    S/P minimally invasive aortic valve replacement with bioprosthetic valve 10/28/2012   77m Edwards Magna Ease bovine pericardial tissue valve placed via right anterior mini-thoracotomy approach   Seasonal allergies    Shortness of breath    when eating   Past Surgical History:  Procedure Laterality Date   AORTIC VALVE REPLACEMENT N/A 10/28/2012   Procedure: MINIMALLY INVASIVE AORTIC VALVE REPLACEMENT (AVR);  Surgeon: CRexene Alberts MD;  [place]  Service: [place];  Laterality: N/A;   cyst removed from back of spine     INTRAOPERATIVE TRANSESOPHAGEAL ECHOCARDIOGRAM N/A 10/28/2012   Procedure: INTRAOPERATIVE TRANSESOPHAGEAL ECHOCARDIOGRAM;  Surgeon: CRexene Alberts MD;  [place]  Service: Open Heart Surgery;  Laterality: N/A;   LEFT AND RIGHT HEART CATHETERIZATION WITH CORONARY ANGIOGRAM N/A 09/08/2012    Procedure: LEFT AND RIGHT HEART CATHETERIZATION WITH CORONARY ANGIOGRAM;  Surgeon: JLaverda Page MD;  [facility]  Service: Cardiovascular;  Laterality: N/A;   LITHOTRIPSY     right hand surgery     SHOULDER SURGERY  06/2020   TEE WITHOUT CARDIOVERSION N/A 09/01/2012   Procedure: TRANSESOPHAGEAL ECHOCARDIOGRAM (TEE);  Surgeon: JLaverda Page MD;  [facility];  Service: Cardiovascular;  Laterality: N/A;  h&p in file-[facility]   Family History  Problem Relation Age of Onset   Cancer Mother        brain, deceased age 51    Social History   Tobacco Use   Smoking status: Former    Packs/day: 0.50    Years: 20.00    Pack years: 10.00    Types: Cigarettes    Quit date: 2016    Years since quitting: 6.4   Smokeless tobacco: Never  Substance Use Topics   Alcohol use: Not Currently    Alcohol/week: 1.0 - 2.0 standard drink    Types: 1 - 2 Shots of liquor per week   Marital Status: Single  ROS  Review of Systems  Cardiovascular:  Negative for dyspnea on exertion, leg swelling and syncope.  Gastrointestinal:  Negative for melena.  Objective  Blood pressure 125/84, pulse 66, temperature 98.1 F (36.7 C), height 6' (1.829 m), weight 217 lb (98.4 kg), SpO2 98 %.  Vitals with BMI 07/28/2020 07/29/2019 07/24/2018  Height 6' 0"  6' 0"  6' 0"   Weight 217 lbs 215 lbs 217 lbs  BMI 29.42 83.15 17.61  Systolic 607 371 -  Diastolic 84 74 -  Pulse 66 85 -     Physical Exam Vitals reviewed.  Constitutional:      General: He is not in acute distress.    Appearance: He is well-developed.  Cardiovascular:     Rate and Rhythm: Normal rate and regular rhythm.     Pulses: Normal pulses and intact distal pulses.          Carotid pulses are  on the right side with bruit and  on the left side with bruit.    Heart sounds: Murmur heard.  Harsh midsystolic murmur is present with a grade of 3/6 at the upper right sternal border radiating to the neck.    No gallop.     Comments:  No leg edema, no JVD.  Pulmonary:     Effort: Pulmonary effort is normal. No accessory muscle usage or respiratory distress.     Breath sounds: Normal breath sounds.  Abdominal:     General: Bowel sounds are normal.     Palpations: Abdomen is soft.  Neurological:     Mental Status: He is alert.   Laboratory examination:   External labs:   Labs 06/17/2019:  Serum glucose 97 mg, BUN 13, creatinine 1.1, EGFR >60 mL.  Sodium 143, potassium 4.2.  CMP normal.  Hemoglobin 17.7/HCT 49.3, platelets 212.  Glucola 155, triglycerides 141, HDL 40, LDL 87.  Non-HDL cholesterol 115.  TSH normal.  PSA normal.  Medications and allergies   Allergies  Allergen Reactions   Prednisone Swelling and Other (See Comments)    Makes him crazy   Serzone [Nefazodone Hcl] Swelling and Other (See Comments)    Makes him crazy    Current Meds  Medication Sig   ALPRAZolam (XANAX) 0.5 MG tablet Take 0.5 mg by mouth at bedtime as needed. anxiety   atorvastatin (LIPITOR) 80 MG tablet TAKE 1 TABLET BY MOUTH EVERY DAY   carvedilol (COREG) 3.125 MG tablet TAKE 1 TABLET (3.125 MG TOTAL) BY MOUTH 2 (TWO) TIMES DAILY WITH A MEAL. (Patient taking differently: Take 3.125 mg by mouth daily.)   fluticasone (FLONASE) 50 MCG/ACT nasal spray Place 1 spray into the nose daily.    methocarbamol (ROBAXIN) 750 MG tablet Take 750 mg by mouth every 8 (eight) hours as needed.   oxyCODONE (OXY IR/ROXICODONE) 5 MG immediate release tablet Take 1 tablet by mouth as needed.   valsartan (DIOVAN) 80 MG tablet TAKE 1 TABLET BY MOUTH EVERY DAY    Radiology:   No results found.  Cardiac Studies:   Coronary angiogram 09/08/2012: Normal right heart catheterization, normal coronary arteries, right dominant circulation. No thoracic or abdominal aortic aneurysm. Moderate aortic regurgitation, moderate aortic stenosis. Moderate decrease in left frontal systolic function, ejection fraction 35%  Echocardiogram 07/06/2018: Mildly depressed  LV systolic function with EF 46%. Left ventricle cavity is normal in size. Normal global wall motion. Abnormal septal wall motion due to post-operative valve. Normal diastolic filling pattern. Calculated EF 46%. Left atrial cavity is mildly dilated. Well seated bioprosthetic valve sith stable mean PG 15 mmHg, Vmax 2.5 m/sec.  Mild (Grade I) aortic regurgitation. Normal right atrial pressure.  No significant change compared to previous study on 06/04/2017.   EKG  EKG 07/28/2020: Normal sinus rhythm at rate of 62 bpm, left atrial enlargement, left axis deviation, left anterior fascicular block.  IVCD, borderline criteria for LVH.   No significant change from EKG 07/29/18 21  . Assessment     ICD-10-CM   1. Aortic valve prosthesis present  Z95.2 EKG 12-Lead    PCV ECHOCARDIOGRAM COMPLETE    2. Perivalvular leak of prosthetic heart valve, subsequent encounter  T82.03XD PCV ECHOCARDIOGRAM COMPLETE    3. Essential hypertension  I10     4. Hypercholesteremia  E78.00       Recommendations:   RODMAN RECUPERO  is a 50 y.o. male  with history of congenital bicuspid aortic valve undergone aortic valve replacement with a bioprosthetic valve by minimally invasive approach on 10/28/2012, and has a very small paravalvular leak  being managed medically with serial echos.  Patient is presently doing well without any symptoms.  No change in his physical exam, he does have a very prominent murmur conducted to the carotid arteries that is unchanged from prior examinations.  His last echocardiogram 2 year ago, will repeat echocardiogram.  Blood pressure is well controlled, lipids previously well controlled, recently had complete physical examination however blood had clotted and he has been scheduled for repeat blood work.  Office visit in a year or sooner if problems.     Adrian Prows, MD, [facility] 07/28/2020, 5:10 PM Office: [phone] Fax: [phone] Pager: [phone]

## 2020-10-06 ENCOUNTER — Other Ambulatory Visit: Payer: Self-pay | Admitting: Cardiology

## 2020-10-31 ENCOUNTER — Other Ambulatory Visit: Payer: BC Managed Care – PPO

## 2020-11-03 ENCOUNTER — Other Ambulatory Visit: Payer: Self-pay

## 2020-11-03 ENCOUNTER — Other Ambulatory Visit: Payer: BC Managed Care – PPO

## 2020-11-03 ENCOUNTER — Ambulatory Visit: Payer: BC Managed Care – PPO

## 2020-11-03 DIAGNOSIS — I428 Other cardiomyopathies: Secondary | ICD-10-CM

## 2020-11-03 DIAGNOSIS — I5022 Chronic systolic (congestive) heart failure: Secondary | ICD-10-CM

## 2020-11-03 DIAGNOSIS — Z952 Presence of prosthetic heart valve: Secondary | ICD-10-CM

## 2020-11-03 DIAGNOSIS — T8203XD Leakage of heart valve prosthesis, subsequent encounter: Secondary | ICD-10-CM

## 2020-11-09 MED ORDER — CARVEDILOL 6.25 MG PO TABS: 3.1250 mg | ORAL_TABLET | Freq: Two times a day (BID) | ORAL | Status: DC

## 2020-11-09 MED ORDER — SACUBITRIL-VALSARTAN 49-51 MG PO TABS: 1.0000 | ORAL_TABLET | Freq: Two times a day (BID) | ORAL | Status: AC

## 2020-11-09 NOTE — Progress Notes (Unsigned)
  Discussed with the patient regarding his echocardiogram, will uptitrate his carvedilol to 6.25 mg twice daily, after 1 week he will discontinue valsartan and switch to Entresto 49/51 mg twice daily, samples were given to the patient.  I will set him up to see me back in the office 2 weeks from now, will obtain BMP in 10 days.    ICD-10-CM   1. Non-ischemic cardiomyopathy (HCC)  I42.8 sacubitril-valsartan (ENTRESTO) 49-51 mg per tablet    2. Aortic valve prosthesis present  Z95.2 PCV ECHOCARDIOGRAM COMPLETE    3. Perivalvular leak of prosthetic heart valve, subsequent encounter  T82.03XD PCV ECHOCARDIOGRAM COMPLETE    4. Chronic systolic heart failure (HCC)  P49.82 sacubitril-valsartan (ENTRESTO) 49-51 mg per tablet    carvedilol (COREG) 6.25 MG tablet    Basic metabolic panel    Brain natriuretic peptide      Meds ordered this encounter  Medications   sacubitril-valsartan (ENTRESTO) 49-51 mg per tablet    Order Specific Question:   ACE-inhibitors have NOT been administered in the past 36-hours.    Answer:   YES (confirmed by ordering provider)   carvedilol (COREG) 6.25 MG tablet    Sig: Take 0.5 tablets (3.125 mg total) by mouth 2 (two) times daily with a meal.    Medications Discontinued During This Encounter  Medication Reason   valsartan (DIOVAN) 80 MG tablet Change in therapy   carvedilol (COREG) 3.125 MG tablet     Orders Placed This Encounter  Procedures   Basic metabolic panel   Brain natriuretic peptide     Yates Decamp, MD, [facility] 11/09/2020, 8:24 PM Office: [phone] Fax: [phone] Pager: [phone]

## 2020-11-16 ENCOUNTER — Telehealth: Payer: Self-pay | Admitting: Cardiology

## 2020-11-16 NOTE — Telephone Encounter (Signed)
done

## 2020-11-16 NOTE — Telephone Encounter (Signed)
Patient says he requested samples for entresto last week, his partner Bruce [name] will be coming to pick these up today after lunch. However, I do not see any samples in the front. Would someone be able to leave these up front?

## 2020-12-19 ENCOUNTER — Telehealth: Payer: Self-pay

## 2020-12-19 DIAGNOSIS — I428 Other cardiomyopathies: Secondary | ICD-10-CM

## 2020-12-19 NOTE — Telephone Encounter (Signed)
I sent him a MyChart message, I thought I documented that we should start him on Entresto and get BMP in 2 weeks and titrate the medication up and have a office visit with Celeste in 2 weeks after starting Entresto but may be administered.  I sent a MyChart message stating to contact me back about what dose and refired need to call in and also set up an appointment to see me in the office, best option is to go ahead and set up an appointment to be seen in 2 weeks either with me or Celeste.

## 2020-12-19 NOTE — Telephone Encounter (Signed)
Pt called and stated that he was put on entresto and was wanting a refill. I do not see this medication on his list. Please advise.

## 2020-12-20 MED ORDER — SACUBITRIL-VALSARTAN 49-51 MG PO TABS: 1.0000 | ORAL_TABLET | Freq: Two times a day (BID) | ORAL | 2 refills | Status: DC

## 2020-12-20 NOTE — Telephone Encounter (Signed)
Called pt, no answer. Left vm requesting call back. Dr. Jacinto Halim left the PT a message on mychart.

## 2020-12-20 NOTE — Telephone Encounter (Signed)
Pt aware.

## 2020-12-20 NOTE — Telephone Encounter (Signed)
ICD-10-CM   1. Non-ischemic cardiomyopathy (HCC)  I42.8 sacubitril-valsartan (ENTRESTO) 49-51 MG     Meds ordered this encounter  Medications   sacubitril-valsartan (ENTRESTO) 49-51 MG    Sig: Take 1 tablet by mouth 2 (two) times daily.    Dispense:  60 tablet    Refill:  2    Will get BMP and BNP and see him in 6-8 weeks   Yates Decamp, MD, [facility] 12/20/2020, 4:27 PM Office: [phone] Fax: [phone] Pager: [phone]

## 2021-01-04 NOTE — Addendum Note (Signed)
Addended by: Delrae Rend on: 01/04/2021 05:38 PM   Modules accepted: Orders

## 2021-02-21 LAB — BASIC METABOLIC PANEL
BUN/Creatinine Ratio: 13 (ref 9–20)
BUN: 12 mg/dL (ref 6–24)
CO2: 22 mmol/L (ref 20–29)
Calcium: 9.6 mg/dL (ref 8.7–10.2)
Chloride: 106 mmol/L (ref 96–106)
Creatinine, Ser: 0.96 mg/dL (ref 0.76–1.27)
Glucose: 83 mg/dL (ref 70–99)
Potassium: 4.5 mmol/L (ref 3.5–5.2)
Sodium: 144 mmol/L (ref 134–144)
eGFR: 96 mL/min/{1.73_m2} (ref >59)

## 2021-02-21 LAB — BRAIN NATRIURETIC PEPTIDE: BNP: 54 pg/mL (ref 0.0–100.0)

## 2021-03-01 ENCOUNTER — Encounter: Payer: Self-pay | Admitting: Cardiology

## 2021-03-01 ENCOUNTER — Ambulatory Visit: Payer: BC Managed Care – PPO | Admitting: Cardiology

## 2021-03-01 ENCOUNTER — Other Ambulatory Visit: Payer: Self-pay

## 2021-03-01 VITALS — BP 111/75 | HR 84 | Temp 97.9°F | Resp 16 | Ht 72.0 in | Wt 213.8 lb

## 2021-03-01 DIAGNOSIS — T8203XD Leakage of heart valve prosthesis, subsequent encounter: Secondary | ICD-10-CM

## 2021-03-01 DIAGNOSIS — I10 Essential (primary) hypertension: Secondary | ICD-10-CM

## 2021-03-01 DIAGNOSIS — I5022 Chronic systolic (congestive) heart failure: Secondary | ICD-10-CM

## 2021-03-01 DIAGNOSIS — I428 Other cardiomyopathies: Secondary | ICD-10-CM

## 2021-03-01 DIAGNOSIS — Z952 Presence of prosthetic heart valve: Secondary | ICD-10-CM

## 2021-03-01 DIAGNOSIS — E7800 Pure hypercholesterolemia, unspecified: Secondary | ICD-10-CM | POA: Insufficient documentation

## 2021-03-01 MED ORDER — SACUBITRIL-VALSARTAN 49-51 MG PO TABS: 1.0000 | ORAL_TABLET | Freq: Two times a day (BID) | ORAL | 3 refills | Status: DC

## 2021-03-01 NOTE — Progress Notes (Signed)
Primary Physician/Referring:  [name], MD  Patient ID: Bruce [name], male    DOB: 10-30-1970, 51 y.o.   MRN: [id number]  Chief Complaint  Patient presents with   Aortic valve prosthesis present   Follow-up    41month   HPI:    Bruce [name] is a 51y.o. male  with history of congenital bicuspid aortic valve undergone aortic valve replacement with a bioprosthetic valve by minimally invasive approach on 10/28/2012, and has a very small paravalvular leak  being managed medically with serial echos.  Patient had echocardiogram 3 months ago and had revealed a new LV systolic dysfunction, I did start him on Entresto.  He is presently doing well. Denies shortness of breath, PND, orthopnea, hemoptysis.  No leg edema, palpitations, dizziness or syncope.  With EPraxair he initially had difficulty even with low-dose with marked dizziness but has gradually escalated the dose and is presently tolerating this with occasional episodes of dizziness.  Past Medical History:  Diagnosis Date   Anxiety    Xanax prn and takes at night to sleep   Aortic regurgitation    Aortic stenosis    Arthritis    back and neck   [place]    Heart murmur    bicuspid valve   History of kidney stones    Hyperlipidemia    takes Atorvastatin daily   Joint swelling    S/P minimally invasive aortic valve replacement with bioprosthetic valve 10/28/2012   276mEdwards Magna Ease bovine pericardial tissue valve placed via right anterior mini-thoracotomy approach   Seasonal allergies    Shortness of breath    when eating   Past Surgical History:  Procedure Laterality Date   AORTIC VALVE REPLACEMENT N/A 10/28/2012   Procedure: MINIMALLY INVASIVE AORTIC VALVE REPLACEMENT (AVR);  Surgeon: ClRexene AlbertsMD;  [place] [facility] [place] Service: [place] Surgery;  Laterality: N/A;   cyst removed from back of spine     INTRAOPERATIVE TRANSESOPHAGEAL ECHOCARDIOGRAM N/A 10/28/2012    Procedure: INTRAOPERATIVE TRANSESOPHAGEAL ECHOCARDIOGRAM;  Surgeon: ClRexene AlbertsMD;  [place] [facility]: [place];  Laterality: N/A;   LEFT AND RIGHT HEART CATHETERIZATION WITH CORONARY ANGIOGRAM N/A 09/08/2012   Procedure: LEFT AND RIGHT HEART CATHETERIZATION WITH CORONARY ANGIOGRAM;  Surgeon: JaLaverda PageMD;  [facility];  Service: Cardiovascular;  Laterality: N/A;   LITHOTRIPSY     right hand surgery     SHOULDER SURGERY  06/2020   TEE WITHOUT CARDIOVERSION N/A 09/01/2012   Procedure: TRANSESOPHAGEAL ECHOCARDIOGRAM (TEE);  Surgeon: JaLaverda PageMD;  [facility];  Service: Cardiovascular;  Laterality: N/A;  h&p in file-[facility]   Family History  Problem Relation Age of Onset   Cancer Mother        brain, deceased age 52   Social History   Tobacco Use   Smoking status: Former    Packs/day: 0.50    Years: 20.00    Pack years: 10.00    Types: Cigarettes    Quit date: 2016    Years since quitting: 7.0   Smokeless tobacco: Never  Substance Use Topics   Alcohol use: Not Currently    Alcohol/week: 1.0 - 2.0 standard drink    Types: 1 - 2 Shots of liquor per week   Marital Status: Single  ROS  Review of Systems  Cardiovascular:  Negative for dyspnea on exertion, leg swelling and syncope.  Gastrointestinal:  Negative for melena.  Objective  Blood pressure 111/75, pulse 84, temperature 97.9 F (36.6 C), temperature source Temporal, resp. rate 16, height 6' (1.829 m), weight 213 lb 12.8 oz (97 kg), SpO2 99 %.  Vitals with BMI 03/01/2021 07/28/2020 07/29/2019  Height 6' 0"  6' 0"  6' 0"   Weight 213 lbs 13 oz 217 lbs 215 lbs  BMI 28.99 35.70 17.79  Systolic 390 300 923  Diastolic 75 84 74  Pulse 84 66 85     Physical Exam Vitals reviewed.  Constitutional:      General: He is not in acute distress.    Appearance: He is well-developed.  Cardiovascular:     Rate and Rhythm: Normal rate and regular rhythm.     Pulses:  Normal pulses and intact distal pulses.          Carotid pulses are  on the right side with bruit and  on the left side with bruit.    Heart sounds: Murmur heard.  Harsh midsystolic murmur is present with a grade of 3/6 at the upper right sternal border radiating to the neck.    No gallop.     Comments: No leg edema, no JVD.  Pulmonary:     Effort: Pulmonary effort is normal. No accessory muscle usage or respiratory distress.     Breath sounds: Normal breath sounds.  Abdominal:     General: Bowel sounds are normal.     Palpations: Abdomen is soft.   Laboratory examination:   CMP Latest Ref Rng & Units 02/20/2021 11/01/2012 10/31/2012  Glucose 70 - 99 mg/dL 83 99 109(H)  BUN 6 - 24 mg/dL 12 16 15   Creatinine 0.76 - 1.27 mg/dL 0.96 0.90 0.90  Sodium 134 - 144 mmol/L 144 138 135  Potassium 3.5 - 5.2 mmol/L 4.5 3.4(L) 3.7  Chloride 96 - 106 mmol/L 106 100 100  CO2 20 - 29 mmol/L 22 24 23   Calcium 8.7 - 10.2 mg/dL 9.6 9.1 8.8  Total Protein 6.0 - 8.3 g/dL - - -  Total Bilirubin 0.3 - 1.2 mg/dL - - -  Alkaline Phos 39 - 117 U/L - - -  AST 0 - 37 U/L - - -  ALT 0 - 53 U/L - - -    External labs:   Labs 06/17/2019:  Serum glucose 97 mg, BUN 13, creatinine 1.1, EGFR >60 mL.  Sodium 143, potassium 4.2.  CMP normal.  Hemoglobin 17.7/HCT 49.3, platelets 212.  Glucola 155, triglycerides 141, HDL 40, LDL 87.  Non-HDL cholesterol 115.  TSH normal.  PSA normal.  Medications and allergies   Allergies  Allergen Reactions   Prednisone Swelling and Other (See Comments)    Makes him crazy   Serzone [Nefazodone Hcl] Swelling and Other (See Comments)    Makes him crazy    Current Meds  Medication Sig   ALPRAZolam (XANAX) 0.5 MG tablet Take 0.5 mg by mouth at bedtime as needed. anxiety   atorvastatin (LIPITOR) 80 MG tablet TAKE 1 TABLET BY MOUTH EVERY DAY   carvedilol (COREG) 6.25 MG tablet Take 0.5 tablets (3.125 mg total) by mouth 2 (two) times daily with a meal.   oxyCODONE (OXY  IR/ROXICODONE) 5 MG immediate release tablet Take 1 tablet by mouth as needed.   [DISCONTINUED] sacubitril-valsartan (ENTRESTO) 49-51 MG Take 1 tablet by mouth 2 (two) times daily.    Radiology:   No results found.  Cardiac Studies:   Coronary angiogram 09/08/2012: Normal right heart catheterization, normal coronary arteries, right dominant circulation. No  thoracic or abdominal aortic aneurysm. Moderate aortic regurgitation, moderate aortic stenosis. Moderate decrease in left frontal systolic function, ejection fraction 35%  PCV ECHOCARDIOGRAM COMPLETE 11/03/2020  1. Left ventricle cavity is normal in size. Moderate concentric hypertrophy of the left ventricle. Hypokinetic global wall motion. Normal diastolic filling pattern, normal LAP. Moderately depressed LV systolic function with visual EF 30-35%. Calculated EF 20%. 2. Left atrial cavity is moderately dilated by volume. 3. Bioprosthetic aortic valve. No evidence of aortic stenosis. Trace paravalvular aortic regurgitation. Normal aortic valve leaflet mobility. 4. Compared to the study done on 07/06/2018, LVEF has decreased from 40-45% to the present 30%. Otherwise no significant change.  EKG  EKG 03/01/2020: Normal sinus rhythm at rate of 57 bpm, left atrial enlargement, left axis deviation, left anterior fascicular block.  IVCD, borderline criteria for LVH.  No significant change from 07/28/2020. . Assessment     ICD-10-CM   1. Non-ischemic cardiomyopathy ([place])  I42.8 EKG 12-Lead    sacubitril-valsartan (ENTRESTO) 49-51 MG    2. Chronic systolic heart failure (HCC)  I50.22 sacubitril-valsartan (ENTRESTO) 49-51 MG    3. Aortic valve prosthesis present  Z95.2     4. Perivalvular leak of prosthetic heart valve, subsequent encounter  T82.03XD     5. Primary hypertension  I10       Recommendations:   Bruce [name]  is a 51 y.o. male  with history of congenital bicuspid aortic valve undergone aortic valve replacement with a  bioprosthetic valve by minimally invasive approach on 10/28/2012, and has a very small paravalvular leak  being managed medically with serial echos.  Patient had echocardiogram 3 months ago and had revealed a new LV systolic dysfunction, I did start him on Entresto.  He has not been able to tolerate more than moderate dose due to dizziness.  His blood pressure is soft and well controlled.  We will continue present dose carvedilol at moderate dose along with Entresto and I will repeat echocardiogram in 3 months and I would like to see him back then.  With regard to prosthetic aortic valve and paravalvular aortic valve regurgitation, this is remained stable.  He is aware that he needs endocarditis prophylaxis.   Adrian Prows, MD, [facility] 03/01/2021, 2:10 PM Office: [phone] Fax: [phone] Pager: [phone]

## 2021-03-16 ENCOUNTER — Other Ambulatory Visit: Payer: Self-pay | Admitting: Cardiology

## 2021-03-16 DIAGNOSIS — I5022 Chronic systolic (congestive) heart failure: Secondary | ICD-10-CM

## 2021-03-16 DIAGNOSIS — I428 Other cardiomyopathies: Secondary | ICD-10-CM

## 2021-04-05 ENCOUNTER — Encounter: Payer: Self-pay | Admitting: Cardiology

## 2021-04-28 ENCOUNTER — Other Ambulatory Visit: Payer: Self-pay | Admitting: Cardiology

## 2021-05-17 ENCOUNTER — Ambulatory Visit: Payer: BC Managed Care – PPO

## 2021-05-17 DIAGNOSIS — T8203XD Leakage of heart valve prosthesis, subsequent encounter: Secondary | ICD-10-CM

## 2021-05-17 DIAGNOSIS — I428 Other cardiomyopathies: Secondary | ICD-10-CM

## 2021-05-17 DIAGNOSIS — I5022 Chronic systolic (congestive) heart failure: Secondary | ICD-10-CM

## 2021-06-01 ENCOUNTER — Encounter: Payer: Self-pay | Admitting: Cardiology

## 2021-06-01 ENCOUNTER — Ambulatory Visit: Payer: BC Managed Care – PPO | Admitting: Cardiology

## 2021-06-01 VITALS — BP 105/74 | HR 74 | Temp 98.2°F | Resp 16 | Ht 72.0 in | Wt 219.0 lb

## 2021-06-01 DIAGNOSIS — I951 Orthostatic hypotension: Secondary | ICD-10-CM

## 2021-06-01 DIAGNOSIS — I428 Other cardiomyopathies: Secondary | ICD-10-CM

## 2021-06-01 DIAGNOSIS — I5022 Chronic systolic (congestive) heart failure: Secondary | ICD-10-CM

## 2021-06-01 DIAGNOSIS — I10 Essential (primary) hypertension: Secondary | ICD-10-CM

## 2021-06-01 DIAGNOSIS — Z952 Presence of prosthetic heart valve: Secondary | ICD-10-CM

## 2021-06-01 NOTE — Progress Notes (Signed)
? ?Primary Physician/Referring:  [name], MD ? ?Patient ID: Bruce [name], male    DOB: [birth year], 51 y.o.   MRN: [id number] ? ?Chief Complaint  ?Patient presents with  ? Cardiomyopathy  ? Follow-up  ?  3 month  ? Results  ?  echo  ? ?HPI:   ? ?Bruce [name]  is a 51 y.o. male  with history of congenital bicuspid aortic valve undergone aortic valve replacement with a bioprosthetic valve by minimally invasive approach on 10/28/2012 with implantation of a [facility] Ease 23 mm pericardial valve, and has a very small paravalvular leak  being managed medically with serial echos. ? ?Patient had echocardiogram 3 months ago and had revealed a new LV systolic dysfunction, I did start him on Entresto.  He has not been able to tolerate more than moderate dose due to dizziness.  He has with great difficulty increased the dose of Entresto from low-dose to intermediate dose, states that it is "rough on him". ? ?He also complains of puffiness in his eyes when he wakes up in the morning, states that his eyelids are completely covered up. ? ?Denies shortness of breath, PND, orthopnea, hemoptysis.  No leg edema, palpitations, dizziness or syncope.   ? ?Past Medical History:  ?Diagnosis Date  ? Anxiety   ? Xanax prn and takes at night to sleep  ? Aortic regurgitation   ? Aortic stenosis   ? Arthritis   ? back and neck  ? Cardiomyopathy ([place])   ? Heart murmur   ? bicuspid valve  ? History of kidney stones   ? Hyperlipidemia   ? takes Atorvastatin daily  ? Joint swelling   ? S/P minimally invasive aortic valve replacement with bioprosthetic valve 10/28/2012  [facility] via right anterior mini-thoracotomy approach  ? Seasonal allergies   ? Shortness of breath   ? when eating  ? ?Past Surgical History:  ?Procedure Laterality Date  ? AORTIC VALVE REPLACEMENT N/A 10/28/2012  ? Procedure: MINIMALLY INVASIVE AORTIC VALVE REPLACEMENT (AVR);  Surgeon: CRexene Alberts  MD;  [place]  Service: Open Heart Surgery;  Laterality: N/A;  ? cyst removed from back of spine    ? INTRAOPERATIVE TRANSESOPHAGEAL ECHOCARDIOGRAM N/A 10/28/2012  ? Procedure: INTRAOPERATIVE TRANSESOPHAGEAL ECHOCARDIOGRAM;  Surgeon: CRexene Alberts MD;  [place]  Service: Open Heart Surgery;  Laterality: N/A;  ? LEFT AND RIGHT HEART CATHETERIZATION WITH CORONARY ANGIOGRAM N/A 09/08/2012  ? Procedure: LEFT AND RIGHT HEART CATHETERIZATION WITH CORONARY ANGIOGRAM;  Surgeon: JLaverda Page MD;  [facility];  Service: Cardiovascular;  Laterality: N/A;  ? LITHOTRIPSY    ? right hand surgery    ? SHOULDER SURGERY  06/2020  ? TEE WITHOUT CARDIOVERSION N/A 09/01/2012  ? Procedure: TRANSESOPHAGEAL ECHOCARDIOGRAM (TEE);  Surgeon: JLaverda Page MD;  [place] [facility]  Service: Cardiovascular;  Laterality: N/A;  h&p in file-[facility]  ? ?Family History  ?Problem Relation Age of Onset  ? Cancer Mother   ?     brain, deceased age 52  ?  ?Social History  ? ?Tobacco Use  ? Smoking status: Former  ?  Packs/day: 0.50  ?  Years: 20.00  ?  Pack years: 10.00  ?  Types: Cigarettes  ?  Quit date: 2016  ?  Years since quitting: 7.2  ? Smokeless tobacco: Never  ?Substance Use Topics  ? Alcohol use: Not Currently  ?  Alcohol/week:  1.0 - 2.0 standard drink  ?  Types: 1 - 2 Shots of liquor per week  ? ?Marital Status: Single  ?ROS  ?Review of Systems  ?Cardiovascular:  Negative for dyspnea on exertion, leg swelling and syncope.  ?Gastrointestinal:  Negative for melena.  ?Objective  ?Blood pressure 105/74, pulse 74, temperature 98.2 ?F (36.8 ?C), resp. rate 16, height 6' (1.829 m), weight 219 lb (99.3 kg), SpO2 98 %.  ? ?  06/01/2021  ?  1:45 PM 03/01/2021  ?  1:19 PM 07/28/2020  ?  1:39 PM  ?Vitals with BMI  ?Height _0  _1  _2   ?Weight 219 lbs 213 lbs 13 oz 217 lbs  ?BMI 29.7 28.99 29.42  ?Systolic 383 338 329  ?Diastolic 74 75 84  ?Pulse 74 84 66  ?  ?Orthostatic VS for the past 72 hrs (Last 3  readings): ? Orthostatic BP Patient Position BP Location Cuff Size Orthostatic Pulse  ?06/01/21 1420 98/73 Standing Left Arm Large 78  ?06/01/21 1419 120/67 Sitting Left Arm Large 50  ?06/01/21 1418 104/71 Supine Left Arm Large 59  ?  ? Physical Exam ?Vitals reviewed.  ?Constitutional:   ?   General: He is not in acute distress. ?   Appearance: He is well-developed.  ?Cardiovascular:  ?   Rate and Rhythm: Normal rate and regular rhythm.  ?   Pulses: Normal pulses and intact distal pulses.     ?     Carotid pulses are  on the right side with bruit and  on the left side with bruit. ?   Heart sounds: Murmur heard.  ?Harsh midsystolic murmur is present with a grade of 3/6 at the upper right sternal border radiating to the neck.  ?  No gallop.  ?   Comments: No leg edema, no JVD.  ?Pulmonary:  ?   Effort: Pulmonary effort is normal. No accessory muscle usage or respiratory distress.  ?   Breath sounds: Normal breath sounds.  ?Abdominal:  ?   General: Bowel sounds are normal.  ?   Palpations: Abdomen is soft.  ? ?Laboratory examination:  ? ? ?  Latest Ref Rng & Units 02/20/2021  ?  2:43 PM 11/01/2012  ?  5:10 AM 10/31/2012  ?  6:25 AM  ?CMP  ?Glucose 70 - 99 mg/dL 83   99   109    ?BUN 6 - 24 mg/dL _3 ?Creatinine 0.76 - 1.27 mg/dL 0.96   0.90   0.90    ?Sodium 134 - 144 mmol/L 144   138   135    ?Potassium 3.5 - 5.2 mmol/L 4.5   3.4   3.7    ?Chloride 96 - 106 mmol/L 106   100   100    ?CO2 20 - 29 mmol/L _4 ?Calcium 8.7 - 10.2 mg/dL 9.6   9.1   8.8    ?  ?External labs:  ? ?Labs 06/17/2019:  ?Serum glucose 97 mg, BUN 13, creatinine 1.1, EGFR >60 mL.  Sodium 143, potassium 4.2.  CMP normal. ? ?Hemoglobin 17.7/HCT 49.3, platelets 212. ? ?T. Cholesterol 155, triglycerides 141, HDL 40, LDL 87.  Non-HDL cholesterol 115. ? ?TSH normal.  PSA normal. ? ?Medications and allergies  ? ?Allergies  ?Allergen Reactions  ? Prednisone Swelling and Other (See Comments)  ?  Makes him crazy  ? Serzone [Nefazodone Hcl]  Swelling  and Other (See Comments)  ?  Makes him crazy  ?  ? ?Current Outpatient Medications:  ?  ALPRAZolam (XANAX) 0.5 MG tablet, Take 0.5 mg by mouth at bedtime as needed. anxiety, Disp: , Rfl:  ?  atorvastatin (LIPITOR) 40 MG tablet, Take 40 mg by mouth daily., Disp: , Rfl:  ?  carvedilol (COREG) 6.25 MG tablet, Take 0.5 tablets (3.125 mg total) by mouth 2 (two) times daily with a meal., Disp: , Rfl:  ?  oxyCODONE (OXY IR/ROXICODONE) 5 MG immediate release tablet, Take 1 tablet by mouth as needed., Disp: , Rfl:  ?  sacubitril-valsartan (ENTRESTO) 49-51 MG, Take 0.5 tablets by mouth 2 (two) times daily., Disp: 180 tablet, Rfl: 1  ? ? ?Radiology:  ? ?No results found. ? ?Cardiac Studies:  ? ?Coronary angiogram 09/08/2012: Normal right heart catheterization, normal coronary arteries, right dominant circulation. No thoracic or abdominal aortic aneurysm. Moderate aortic regurgitation, moderate aortic stenosis. Moderate decrease in left frontal systolic function, ejection fraction 35% ? ? AV replacement by minimally invasive surgery 10/28/2012: [facility] Ease pericardial tissue valve (size 23 mm, model # 3300TFX, serial # [id number]) ? ?Echocardiogram 05/17/2021: ?Moderately depressed LV systolic function with visual EF 35-40%. Left ventricle cavity is dilated in size with global hypokinesis. Mild left ventricular hypertrophy. Doppler evidence of grade I (impaired) diastolic dysfunction, normal LAP. ?Bioprosthetic valve is well-seated, no evidence of dehiscence, trivial perivalvular regurgitation.  ?Findings suggestive of moderate aortic stenosis. AV Pk Grad of 41 mmHg, AV Mean Grad of 27.1 mmHg, AV Pk Vel of 3.2 m/s, AVA (VTI) calculated area of 0.7 cm^2, DVI 0.2, AT 151mec . ?Mild (Grade I) valvular aortic regurgitation.  ?Compared to study 11/03/2020 LVEF improved marginally from 30-35% to 35-40%, valvular AS and AR findings are new, otherwise no significant change. ? ?EKG ? ?EKG 03/01/2020: Normal sinus rhythm at  rate of 57 bpm, left atrial enlargement, left axis deviation, left anterior fascicular block.  IVCD, borderline criteria for LVH.  No significant change from 07/28/2020. ?. ?Assessment  ? ?  ICD-10-CM   ?1. Non

## 2021-06-02 ENCOUNTER — Encounter: Payer: Self-pay | Admitting: Cardiology

## 2021-06-14 LAB — CMP14+EGFR
ALT: 30 IU/L (ref 0–44)
AST: 24 IU/L (ref 0–40)
Albumin/Globulin Ratio: 2 (ref 1.2–2.2)
Albumin: 4.7 g/dL (ref 3.8–4.9)
Alkaline Phosphatase: 83 IU/L (ref 44–121)
BUN/Creatinine Ratio: 9 (ref 9–20)
BUN: 10 mg/dL (ref 6–24)
Bilirubin Total: 0.4 mg/dL (ref 0.0–1.2)
CO2: 20 mmol/L (ref 20–29)
Calcium: 9.5 mg/dL (ref 8.7–10.2)
Chloride: 108 mmol/L — ABNORMAL HIGH (ref 96–106)
Creatinine, Ser: 1.07 mg/dL (ref 0.76–1.27)
Globulin, Total: 2.3 g/dL (ref 1.5–4.5)
Glucose: 134 mg/dL — ABNORMAL HIGH (ref 70–99)
Potassium: 4.1 mmol/L (ref 3.5–5.2)
Sodium: 142 mmol/L (ref 134–144)
Total Protein: 7 g/dL (ref 6.0–8.5)
eGFR: 84 mL/min/{1.73_m2} (ref >59)

## 2021-06-14 LAB — URINALYSIS
Bilirubin, UA: NEGATIVE
Glucose, UA: NEGATIVE
Ketones, UA: NEGATIVE
Leukocytes,UA: NEGATIVE
Nitrite, UA: NEGATIVE
Protein,UA: NEGATIVE
RBC, UA: NEGATIVE
Specific Gravity, UA: 1.011 (ref 1.005–1.030)
Urobilinogen, Ur: 0.2 mg/dL (ref 0.2–1.0)
pH, UA: 5.5 (ref 5.0–7.5)

## 2021-06-14 LAB — CBC
Hematocrit: 47.7 % (ref 37.5–51.0)
Hemoglobin: 16.3 g/dL (ref 13.0–17.7)
MCH: 29.7 pg (ref 26.6–33.0)
MCHC: 34.2 g/dL (ref 31.5–35.7)
MCV: 87 fL (ref 79–97)
Platelets: 216 10*3/uL (ref 150–450)
RBC: 5.49 x10E6/uL (ref 4.14–5.80)
RDW: 13 % (ref 11.6–15.4)
WBC: 8.8 10*3/uL (ref 3.4–10.8)

## 2021-06-14 LAB — PROTEIN / CREATININE RATIO, URINE
Creatinine, Urine: 88.6 mg/dL
Protein, Ur: 5 mg/dL
Protein/Creat Ratio: 56 mg/g creat (ref 0–200)

## 2021-06-15 ENCOUNTER — Ambulatory Visit: Payer: BC Managed Care – PPO | Admitting: Cardiology

## 2021-06-15 ENCOUNTER — Encounter: Payer: Self-pay | Admitting: Cardiology

## 2021-06-15 VITALS — BP 105/71 | HR 89 | Temp 97.8°F | Resp 17 | Ht 72.0 in | Wt 219.0 lb

## 2021-06-15 DIAGNOSIS — I428 Other cardiomyopathies: Secondary | ICD-10-CM

## 2021-06-15 DIAGNOSIS — Z952 Presence of prosthetic heart valve: Secondary | ICD-10-CM

## 2021-06-15 DIAGNOSIS — I5020 Unspecified systolic (congestive) heart failure: Secondary | ICD-10-CM

## 2021-06-15 DIAGNOSIS — T82857S Stenosis of cardiac prosthetic devices, implants and grafts, sequela: Secondary | ICD-10-CM

## 2021-06-15 DIAGNOSIS — T8209XS Other mechanical complication of heart valve prosthesis, sequela: Secondary | ICD-10-CM

## 2021-06-15 NOTE — Progress Notes (Signed)
? ?Primary Physician/Referring:  Chilton Greathouse, MD ? ?Patient ID: Bruce [name], male    DOB: 11/13/1970, 51 y.o.   MRN: [id number] ? ?Chief Complaint  ?Patient presents with  ? Non-ischemic cardiomyopathy (HCC)  ?  2 WEEKS  ? ?HPI:   ? ?Bruce [name]  is a 51 y.o. male  with history of congenital bicuspid aortic valve undergone aortic valve replacement with a bioprosthetic valve by minimally invasive approach on 10/28/2012 with implantation of a [facility] Ease 23 mm pericardial valve, and has a very small paravalvular leak  being managed medically with serial echos.  On his last office visit a month ago, I recommended that he needs aortic valve replacement.  He wanted to discuss further along with his partner Brett Canales who is present at the bedside. ? ?Denies shortness of breath, PND, orthopnea, hemoptysis.  No leg edema, palpitations, dizziness or syncope.   ? ?Past Medical History:  ?Diagnosis Date  ? Anxiety   ? Xanax prn and takes at night to sleep  ? Aortic regurgitation   ? Aortic stenosis   ? Arthritis   ? back and neck  ? Cardiomyopathy (HCC)   ? Heart murmur   ? bicuspid valve  ? History of kidney stones   ? Hyperlipidemia   ? takes Atorvastatin daily  ? Joint swelling   ? S/P minimally invasive aortic valve replacement with bioprosthetic valve 10/28/2012  ? 35mm [facility] Ease bovine pericardial tissue valve placed via right anterior mini-thoracotomy approach  ? Seasonal allergies   ? Shortness of breath   ? when eating  ? ?Past Surgical History:  ?Procedure Laterality Date  ? AORTIC VALVE REPLACEMENT N/A 10/28/2012  ? Procedure: MINIMALLY INVASIVE AORTIC VALVE REPLACEMENT (AVR);  Surgeon: Purcell Nails, MD;  Location: [facility] OR;  Service: Open Heart Surgery;  Laterality: N/A;  ? cyst removed from back of spine    ? INTRAOPERATIVE TRANSESOPHAGEAL ECHOCARDIOGRAM N/A 10/28/2012  ? Procedure: INTRAOPERATIVE TRANSESOPHAGEAL ECHOCARDIOGRAM;  Surgeon: Purcell Nails, MD;  Location: [facility] OR;  Service:  Open Heart Surgery;  Laterality: N/A;  ? LEFT AND RIGHT HEART CATHETERIZATION WITH CORONARY ANGIOGRAM N/A 09/08/2012  ? Procedure: LEFT AND RIGHT HEART CATHETERIZATION WITH CORONARY ANGIOGRAM;  Surgeon: Pamella Pert, MD;  Location[facility] CATH LAB;  Service: Cardiovascular;  Laterality: N/A;  ? LITHOTRIPSY    ? right hand surgery    ? SHOULDER SURGERY  06/2020  ? TEE WITHOUT CARDIOVERSION N/A 09/01/2012  ? Procedure: TRANSESOPHAGEAL ECHOCARDIOGRAM (TEE);  Surgeon: Pamella Pert, MD;  Location: [facility] ENDOSCOPY;  Service: Cardiovascular;  Laterality: N/A;  h&p in file[facility]  ? ?Family History  ?Problem Relation Age of Onset  ? Cancer Mother   ?     brain, deceased age 52   ?  ?Social History  ? ?Tobacco Use  ? Smoking status: Former  ?  Packs/day: 0.50  ?  Years: 20.00  ?  Pack years: 10.00  ?  Types: Cigarettes  ?  Quit date: 2016  ?  Years since quitting: 7.3  ? Smokeless tobacco: Never  ?Substance Use Topics  ? Alcohol use: Not Currently  ?  Alcohol/week: 1.0 - 2.0 standard drink  ?  Types: 1 - 2 Shots of liquor per week  ? ?Marital Status: Single  ?ROS  ?Review of Systems  ?Cardiovascular:  Negative for dyspnea on exertion, leg swelling and syncope.  ?Gastrointestinal:  Negative for melena.  ?Objective  ?Blood pressure 105/71, pulse 89, temperature 97.8 ?F (  36.6 ?C), temperature source Temporal, resp. rate 17, height 6' (1.829 m), weight 219 lb (99.3 kg), SpO2 98 %.  ? ?  06/15/2021  ?  2:17 PM 06/01/2021  ?  1:45 PM 03/01/2021  ?  1:19 PM  ?Vitals with BMI  ?Height 6\' 0"  6\' 0"  6\' 0"   ?Weight 219 lbs 219 lbs 213 lbs 13 oz  ?BMI 29.7 29.7 28.99  ?Systolic 105 105  ?Diastolic 71 74 75  ?Pulse 89 74 84  ?  ?Orthostatic VS for the past 72 hrs (Last 3 readings): ? Patient Position BP Location Cuff Size  ?06/15/21 1417 Sitting Left Arm Normal  ?  ? Physical Exam ?Vitals reviewed.  ?Constitutional:   ?   General: He is not in acute distress. ?   Appearance: He is well-developed.  ?Cardiovascular:  ?   Rate and Rhythm:  Normal rate and regular rhythm.  ?   Pulses: Normal pulses and intact distal pulses.     ?     Carotid pulses are  on the right side with bruit and  on the left side with bruit. ?   Heart sounds: Murmur heard.  ?Harsh midsystolic murmur is present with a grade of 3/6 at the upper right sternal border radiating to the neck.  ?  No gallop.  ?   Comments: No leg edema, no JVD.  ?Pulmonary:  ?   Effort: Pulmonary effort is normal. No accessory muscle usage or respiratory distress.  ?   Breath sounds: Normal breath sounds.  ?Abdominal:  ?   General: Bowel sounds are normal.  ?   Palpations: Abdomen is soft.  ? ?Laboratory examination:  ? ? ?  Latest Ref Rng & Units 06/13/2021  ?  2:51 PM 02/20/2021  ?  2:43 PM 11/01/2012  ?  5:10 AM  ?CMP  ?Glucose 70 - 99 mg/dL 06/15/2021   83   99    ?BUN 6 - 24 mg/dL 10   12   16     ?Creatinine 0.76 - 1.27 mg/dL 04/20/2021   11/03/2012   119    ?Sodium 134 - 144 mmol/L 142   144   138    ?Potassium 3.5 - 5.2 mmol/L 4.1   4.5   3.4    ?Chloride 96 - 106 mmol/L 108   106   100    ?CO2 20 - 29 mmol/L 20   22   24     ?Calcium 8.7 - 10.2 mg/dL 9.5   9.6   9.1    ?Total Protein 6.0 - 8.5 g/dL 7.0      ?Total Bilirubin 0.0 - 1.2 mg/dL 0.4      ?Alkaline Phos 44 - 121 IU/L 83      ?AST 0 - 40 IU/L 24      ?ALT 0 - 44 IU/L 30      ? ? ?  Latest Ref Rng & Units 06/13/2021  ?  2:51 PM 11/01/2012  ?  5:10 AM 10/31/2012  ?  6:25 AM  ?CBC  ?WBC 3.4 - 10.8 x10E3/uL 8.8   10.7   14.3    ?Hemoglobin 13.0 - 17.7 g/dL 1.44     06/15/2021    ?Hematocrit 37.5 - 51.0 % 47.7   30.8   31.5    ?Platelets 150 - 450 x10E3/uL 216   166   114    ? ?HEMOGLOBIN A1C ?Lab Results  ?Component Value Date  ? HGBA1C 6.0 (H) 10/26/2012  ?  MPG 126 (H) 10/26/2012  ? ?BNP (last 3 results) ?Recent Labs  ?  02/20/21 ?1443  ?BNP 54.0  ? ?External labs:  ? ?Cholesterol, total 160.000 m 06/23/2020 ?HDL 38.000 mg 06/23/2020 ?LDL 94.000 mg 06/23/2020 ?Triglycerides 140.000 m 06/23/2020 ? ?TSH 1.520 06/23/2020 ? ?Medications and allergies  ? ?Allergies  ?Allergen  Reactions  ? Prednisone Swelling and Other (See Comments)  ?  Makes him crazy  ? Serzone [Nefazodone Hcl] Swelling and Other (See Comments)  ?  Makes him crazy  ?  ? ?Current Outpatient Medications:  ?  ALPRAZolam (XANAX) 0.5 MG tablet, Take 0.5 mg by mouth at bedtime as needed. anxiety, Disp: , Rfl:  ?  atorvastatin (LIPITOR) 40 MG tablet, Take 40 mg by mouth daily., Disp: , Rfl:  ?  carvedilol (COREG) 6.25 MG tablet, Take 0.5 tablets (3.125 mg total) by mouth 2 (two) times daily with a meal., Disp: , Rfl:  ?  oxyCODONE (OXY IR/ROXICODONE) 5 MG immediate release tablet, Take 1 tablet by mouth as needed., Disp: , Rfl:  ?  sacubitril-valsartan (ENTRESTO) 49-51 MG, Take 0.5 tablets by mouth 2 (two) times daily., Disp: 180 tablet, Rfl: 1  ? ? ?Radiology:  ? ?No results found. ? ?Cardiac Studies:  ? ?Coronary angiogram 09/08/2012: Normal right heart catheterization, normal coronary arteries, right dominant circulation. No thoracic or abdominal aortic aneurysm. Moderate aortic regurgitation, moderate aortic stenosis. Moderate decrease in left ventricular systolic function, ejection fraction 35% ? ? AV replacement by minimally invasive surgery 10/28/2012: [facility] Ease pericardial tissue valve (size 23 mm, model # 3300TFX, serial # [id number]) ? ?Echocardiogram 05/17/2021: ?Moderately depressed LV systolic function with visual EF 35-40%. Left ventricle cavity is dilated in size with global hypokinesis. Mild left ventricular hypertrophy. Doppler evidence of grade I (impaired) diastolic dysfunction, normal LAP. ?Bioprosthetic valve is well-seated, no evidence of dehiscence, trivial perivalvular regurgitation.  ?Findings suggestive of moderate aortic stenosis. AV Pk Grad of 41 mmHg, AV Mean Grad of 27.1 mmHg, AV Pk Vel of 3.2 m/s, AVA (VTI) calculated area of 0.7 cm^2, DVI 0.2, AT . ?Mild (Grade I) valvular aortic regurgitation.  ?Compared to study 11/03/2020 LVEF improved marginally from 30-35% to 35-40%, valvular  AS and AR findings are new, otherwise no significant change. ? ?EKG ? ?EKG 03/01/2020: Normal sinus rhythm at rate of 57 bpm, left atrial enlargement, left axis deviation, left anterior fascicular block.

## 2021-06-19 ENCOUNTER — Encounter: Payer: Self-pay | Admitting: Cardiology

## 2021-06-20 NOTE — Telephone Encounter (Signed)
From patient.

## 2021-06-26 NOTE — Telephone Encounter (Signed)
From pt

## 2021-07-03 ENCOUNTER — Ambulatory Visit (HOSPITAL_COMMUNITY)
Admission: RE | Admit: 2021-07-03 | Discharge: 2021-07-03 | Disposition: A | Discharge status: Home / Self Care | Payer: BC Managed Care – PPO | Attending: Cardiology | Admitting: Cardiology

## 2021-07-03 ENCOUNTER — Encounter (HOSPITAL_COMMUNITY)
Admission: RE | Disposition: A | Discharge status: Home / Self Care | Payer: Self-pay | Source: Home / Self Care | Attending: Cardiology

## 2021-07-03 ENCOUNTER — Other Ambulatory Visit: Payer: Self-pay

## 2021-07-03 DIAGNOSIS — T82857S Stenosis of cardiac prosthetic devices, implants and grafts, sequela: Secondary | ICD-10-CM | POA: Insufficient documentation

## 2021-07-03 DIAGNOSIS — Z952 Presence of prosthetic heart valve: Secondary | ICD-10-CM | POA: Insufficient documentation

## 2021-07-03 DIAGNOSIS — I428 Other cardiomyopathies: Secondary | ICD-10-CM | POA: Diagnosis not present

## 2021-07-03 DIAGNOSIS — I350 Nonrheumatic aortic (valve) stenosis: Secondary | ICD-10-CM | POA: Insufficient documentation

## 2021-07-03 DIAGNOSIS — I5020 Unspecified systolic (congestive) heart failure: Secondary | ICD-10-CM | POA: Diagnosis not present

## 2021-07-03 DIAGNOSIS — T8209XS Other mechanical complication of heart valve prosthesis, sequela: Secondary | ICD-10-CM | POA: Diagnosis not present

## 2021-07-03 DIAGNOSIS — Y831 Surgical operation with implant of artificial internal device as the cause of abnormal reaction of the patient, or of later complication, without mention of misadventure at the time of the procedure: Secondary | ICD-10-CM | POA: Diagnosis not present

## 2021-07-03 HISTORY — PX: RIGHT HEART CATH AND CORONARY ANGIOGRAPHY: CATH118264

## 2021-07-03 LAB — POCT I-STAT 7, (LYTES, BLD GAS, ICA,H+H)
Acid-base deficit: 3 mmol/L — ABNORMAL HIGH (ref 0.0–2.0)
Bicarbonate: 22 mmol/L (ref 20.0–28.0)
Calcium, Ion: 1.2 mmol/L (ref 1.15–1.40)
HCT: 45 % (ref 39.0–52.0)
Hemoglobin: 15.3 g/dL (ref 13.0–17.0)
O2 Saturation: 97 %
Potassium: 3.8 mmol/L (ref 3.5–5.1)
Sodium: 144 mmol/L (ref 135–145)
TCO2: 23 mmol/L (ref 22–32)
pCO2 arterial: 38.6 mmHg (ref 32–48)
pH, Arterial: 7.365 (ref 7.35–7.45)
pO2, Arterial: 90 mmHg (ref 83–108)

## 2021-07-03 LAB — POCT I-STAT EG7
Acid-base deficit: 3 mmol/L — ABNORMAL HIGH (ref 0.0–2.0)
Bicarbonate: 23.1 mmol/L (ref 20.0–28.0)
Calcium, Ion: 1.21 mmol/L (ref 1.15–1.40)
HCT: 45 % (ref 39.0–52.0)
Hemoglobin: 15.3 g/dL (ref 13.0–17.0)
O2 Saturation: 75 %
Potassium: 3.8 mmol/L (ref 3.5–5.1)
Sodium: 144 mmol/L (ref 135–145)
TCO2: 24 mmol/L (ref 22–32)
pCO2, Ven: 42 mmHg — ABNORMAL LOW (ref 44–60)
pH, Ven: 7.349 (ref 7.25–7.43)
pO2, Ven: 42 mmHg (ref 32–45)

## 2021-07-03 SURGERY — RIGHT HEART CATH AND CORONARY ANGIOGRAPHY
Anesthesia: LOCAL

## 2021-07-03 MED ORDER — HYDRALAZINE HCL 20 MG/ML IJ SOLN: 10.0000 mg | INTRAMUSCULAR | Status: DC | PRN

## 2021-07-03 MED ORDER — HEPARIN (PORCINE) IN NACL 1000-0.9 UT/500ML-% IV SOLN
INTRAVENOUS | Status: DC | PRN
  Administered 2021-07-03 (×2): 500 mL

## 2021-07-03 MED ORDER — MIDAZOLAM HCL 2 MG/2ML IJ SOLN
INTRAMUSCULAR | Status: DC | PRN
  Administered 2021-07-03: 1 mg via INTRAVENOUS

## 2021-07-03 MED ORDER — SODIUM CHLORIDE 0.9 % WEIGHT BASED INFUSION
3.0000 mL/kg/h | INTRAVENOUS | Status: AC
  Administered 2021-07-03: 3 mL/kg/h via INTRAVENOUS

## 2021-07-03 MED ORDER — ACETAMINOPHEN 325 MG PO TABS: 650.0000 mg | ORAL_TABLET | ORAL | Status: DC | PRN

## 2021-07-03 MED ORDER — ONDANSETRON HCL 4 MG/2ML IJ SOLN: 4.0000 mg | Freq: Four times a day (QID) | INTRAMUSCULAR | Status: DC | PRN

## 2021-07-03 MED ORDER — ASPIRIN 81 MG PO CHEW
81.0000 mg | CHEWABLE_TABLET | ORAL | Status: AC
  Administered 2021-07-03: 81 mg via ORAL
  Filled 2021-07-03: qty 1

## 2021-07-03 MED ORDER — IOHEXOL 350 MG/ML SOLN
INTRAVENOUS | Status: DC | PRN
  Administered 2021-07-03: 60 mL

## 2021-07-03 MED ORDER — SODIUM CHLORIDE 0.9 % IV SOLN: 250.0000 mL | INTRAVENOUS | Status: DC | PRN

## 2021-07-03 MED ORDER — MIDAZOLAM HCL 2 MG/2ML IJ SOLN
INTRAMUSCULAR | Status: AC
  Filled 2021-07-03: qty 2

## 2021-07-03 MED ORDER — SODIUM CHLORIDE 0.9 % IV SOLN: INTRAVENOUS | Status: AC

## 2021-07-03 MED ORDER — SODIUM CHLORIDE 0.9% FLUSH: 3.0000 mL | Freq: Two times a day (BID) | INTRAVENOUS | Status: DC

## 2021-07-03 MED ORDER — LABETALOL HCL 5 MG/ML IV SOLN: 10.0000 mg | INTRAVENOUS | Status: DC | PRN

## 2021-07-03 MED ORDER — LIDOCAINE HCL (PF) 1 % IJ SOLN
INTRAMUSCULAR | Status: AC
  Filled 2021-07-03: qty 30

## 2021-07-03 MED ORDER — HEPARIN (PORCINE) IN NACL 1000-0.9 UT/500ML-% IV SOLN
INTRAVENOUS | Status: AC
  Filled 2021-07-03: qty 1000

## 2021-07-03 MED ORDER — HEPARIN SODIUM (PORCINE) 1000 UNIT/ML IJ SOLN
INTRAMUSCULAR | Status: AC
  Filled 2021-07-03: qty 10

## 2021-07-03 MED ORDER — SODIUM CHLORIDE 0.9% FLUSH: 3.0000 mL | INTRAVENOUS | Status: DC | PRN

## 2021-07-03 MED ORDER — VERAPAMIL HCL 2.5 MG/ML IV SOLN
INTRAVENOUS | Status: DC | PRN
  Administered 2021-07-03: 10 mL via INTRA_ARTERIAL

## 2021-07-03 MED ORDER — FENTANYL CITRATE (PF) 100 MCG/2ML IJ SOLN
INTRAMUSCULAR | Status: AC
  Filled 2021-07-03: qty 2

## 2021-07-03 MED ORDER — HEPARIN SODIUM (PORCINE) 1000 UNIT/ML IJ SOLN
INTRAMUSCULAR | Status: DC | PRN
  Administered 2021-07-03: 5000 [IU] via INTRAVENOUS

## 2021-07-03 MED ORDER — SODIUM CHLORIDE 0.9 % WEIGHT BASED INFUSION: 1.0000 mL/kg/h | INTRAVENOUS | Status: DC

## 2021-07-03 MED ORDER — FENTANYL CITRATE (PF) 100 MCG/2ML IJ SOLN
INTRAMUSCULAR | Status: DC | PRN
  Administered 2021-07-03: 25 ug via INTRAVENOUS

## 2021-07-03 MED ORDER — VERAPAMIL HCL 2.5 MG/ML IV SOLN
INTRAVENOUS | Status: AC
  Filled 2021-07-03: qty 2

## 2021-07-03 MED ORDER — LIDOCAINE HCL (PF) 1 % IJ SOLN
INTRAMUSCULAR | Status: DC | PRN
Start: 2021-07-03 — End: 2021-07-03
  Administered 2021-07-03: 3 mL

## 2021-07-03 SURGICAL SUPPLY — 14 items
CATH BALLN WEDGE 5F 110CM (CATHETERS) ×1 IMPLANT
CATH INFINITI 5FR AL1 (CATHETERS) ×1 IMPLANT
CATH INFINITI 5FR JL4 (CATHETERS) ×1 IMPLANT
CATH OPTITORQUE TIG 4.0 5F (CATHETERS) ×1 IMPLANT
DEVICE RAD COMP TR BAND LRG (VASCULAR PRODUCTS) ×1 IMPLANT
GLIDESHEATH SLEND A-KIT 6F 22G (SHEATH) ×1 IMPLANT
GUIDEWIRE INQWIRE 1.5J.035X260 (WIRE) IMPLANT
INQWIRE 1.5J .035X260CM (WIRE) ×2
KIT HEART LEFT (KITS) ×2 IMPLANT
PACK CARDIAC CATHETERIZATION (CUSTOM PROCEDURE TRAY) ×2 IMPLANT
SHEATH GLIDE SLENDER 4/5FR (SHEATH) ×1 IMPLANT
SYR CONTROL 10ML ANGIOGRAPHIC (SYRINGE) ×1 IMPLANT
TRANSDUCER W/STOPCOCK (MISCELLANEOUS) ×2 IMPLANT
TUBING CIL FLEX 10 FLL-RA (TUBING) ×2 IMPLANT

## 2021-07-03 NOTE — H&P (Signed)
OV 06/15/2021 copied for documentation ? ? ? ?Primary Physician/Referring:  Chilton Greathouse, MD ? ?Patient ID: Bruce [name], male    DOB: [birth year], 51 y.o.   MRN: [id number] ? ?Chief Complaint  ?Patient presents with  ? Non-ischemic cardiomyopathy (HCC)  ?  2 WEEKS  ? ?HPI:   ? ?WINSON OLCOTT  is a 51 y.o. male  with history of congenital bicuspid aortic valve undergone aortic valve replacement with a bioprosthetic valve by minimally invasive approach on 10/28/2012 with implantation of a [facility] Ease 23 mm pericardial valve, and has a very small paravalvular leak  being managed medically with serial echos.  On his last office visit a month ago, I recommended that he needs aortic valve replacement.  He wanted to discuss further along with his partner Brett Canales who is present at the bedside. ? ?Denies shortness of breath, PND, orthopnea, hemoptysis.  No leg edema, palpitations, dizziness or syncope.   ? ?Past Medical History:  ?Diagnosis Date  ? Anxiety   ? Xanax prn and takes at night to sleep  ? Aortic regurgitation   ? Aortic stenosis   ? Arthritis   ? back and neck  ? Cardiomyopathy (HCC)   ? Heart murmur   ? bicuspid valve  ? History of kidney stones   ? Hyperlipidemia   ? takes Atorvastatin daily  ? Joint swelling   ? S/P minimally invasive aortic valve replacement with bioprosthetic valve 10/28/2012  ? 44mm [facility] Ease bovine pericardial tissue valve placed via right anterior mini-thoracotomy approach  ? Seasonal allergies   ? Shortness of breath   ? when eating  ? ?Past Surgical History:  ?Procedure Laterality Date  ? AORTIC VALVE REPLACEMENT N/A 10/28/2012  ? Procedure: MINIMALLY INVASIVE AORTIC VALVE REPLACEMENT (AVR);  Surgeon: Purcell Nails, MD;  Location: [facility] OR;  Service: Open Heart Surgery;  Laterality: N/A;  ? cyst removed from back of spine    ? INTRAOPERATIVE TRANSESOPHAGEAL ECHOCARDIOGRAM N/A 10/28/2012  ? Procedure: INTRAOPERATIVE TRANSESOPHAGEAL ECHOCARDIOGRAM;  Surgeon:  Purcell Nails, MD;  Location: [facility] OR;  Service: Open Heart Surgery;  Laterality: N/A;  ? LEFT AND RIGHT HEART CATHETERIZATION WITH CORONARY ANGIOGRAM N/A 09/08/2012  ? Procedure: LEFT AND RIGHT HEART CATHETERIZATION WITH CORONARY ANGIOGRAM;  Surgeon: Pamella Pert, MD;  Location: [facility] CATH LAB;  Service: Cardiovascular;  Laterality: N/A;  ? LITHOTRIPSY    ? right hand surgery    ? SHOULDER SURGERY  06/2020  ? TEE WITHOUT CARDIOVERSION N/A 09/01/2012  ? Procedure: TRANSESOPHAGEAL ECHOCARDIOGRAM (TEE);  Surgeon: Pamella Pert, MD;  Location: [facility] ENDOSCOPY;  Service: Cardiovascular;  Laterality: N/A;  h&p in file-[facility]  ? ?Family History  ?Problem Relation Age of Onset  ? Cancer Mother   ?     brain, deceased age 32   ?  ?Social History  ? ?Tobacco Use  ? Smoking status: Former  ?  Packs/day: 0.50  ?  Years: 20.00  ?  Pack years: 10.00  ?  Types: Cigarettes  ?  Quit date: 2016  ?  Years since quitting: 7.3  ? Smokeless tobacco: Never  ?Substance Use Topics  ? Alcohol use: Not Currently  ?  Alcohol/week: 1.0 - 2.0 standard drink  ?  Types: 1 - 2 Shots of liquor per week  ? ?Marital Status: Single  ?ROS  ?Review of Systems  ?Cardiovascular:  Negative for dyspnea on exertion, leg swelling and syncope.  ?Gastrointestinal:  Negative for melena.  ?Objective  ?Blood  pressure 105/71, pulse 89, temperature 97.8 ?F (36.6 ?C), temperature source Temporal, resp. rate 17, height 6' (1.829 m), weight 219 lb (99.3 kg), SpO2 98 %.  ? ?  06/15/2021  ?  2:17 PM 06/01/2021  ?  1:45 PM 03/01/2021  ?  1:19 PM  ?Vitals with BMI  ?Height 6\' 0"  6\' 0"  6\' 0"   ?Weight 219 lbs 219 lbs 213 lbs 13 oz  ?BMI 29.7 29.7 28.99  ?Systolic 105 105  ?Diastolic 71 74 75  ?Pulse 89 74 84  ?  ?Orthostatic VS for the past 72 hrs (Last 3 readings): ? Patient Position BP Location Cuff Size  ?06/15/21 1417 Sitting Left Arm Normal  ?  ? Physical Exam ?Vitals reviewed.  ?Constitutional:   ?   General: He is not in acute distress. ?   Appearance: He is  well-developed.  ?Cardiovascular:  ?   Rate and Rhythm: Normal rate and regular rhythm.  ?   Pulses: Normal pulses and intact distal pulses.     ?     Carotid pulses are  on the right side with bruit and  on the left side with bruit. ?   Heart sounds: Murmur heard.  ?Harsh midsystolic murmur is present with a grade of 3/6 at the upper right sternal border radiating to the neck.  ?  No gallop.  ?   Comments: No leg edema, no JVD.  ?Pulmonary:  ?   Effort: Pulmonary effort is normal. No accessory muscle usage or respiratory distress.  ?   Breath sounds: Normal breath sounds.  ?Abdominal:  ?   General: Bowel sounds are normal.  ?   Palpations: Abdomen is soft.  ? ?Laboratory examination:  ? ? ?  Latest Ref Rng & Units 06/13/2021  ?  2:51 PM 02/20/2021  ?  2:43 PM 11/01/2012  ?  5:10 AM  ?CMP  ?Glucose 70 - 99 mg/dL 06/15/2021   83   99    ?BUN 6 - 24 mg/dL 10   12   16     ?Creatinine 0.76 - 1.27 mg/dL 04/20/2021   11/03/2012   096    ?Sodium 134 - 144 mmol/L 142   144   138    ?Potassium 3.5 - 5.2 mmol/L 4.1   4.5   3.4    ?Chloride 96 - 106 mmol/L 108   106   100    ?CO2 20 - 29 mmol/L 20   22   24     ?Calcium 8.7 - 10.2 mg/dL 9.5   9.6   9.1    ?Total Protein 6.0 - 8.5 g/dL 7.0      ?Total Bilirubin 0.0 - 1.2 mg/dL 0.4      ?Alkaline Phos 44 - 121 IU/L 83      ?AST 0 - 40 IU/L 24      ?ALT 0 - 44 IU/L 30      ? ? ?  Latest Ref Rng & Units 06/13/2021  ?  2:51 PM 11/01/2012  ?  5:10 AM 10/31/2012  ?  6:25 AM  ?CBC  ?WBC 3.4 - 10.8 x10E3/uL 8.8   10.7   14.3    ?Hemoglobin 13.0 - 17.7 g/dL 8.11     06/15/2021    ?Hematocrit 37.5 - 51.0 % 47.7   30.8   31.5    ?Platelets 150 - 450 x10E3/uL 216   166   114    ? ?HEMOGLOBIN A1C ?Lab Results  ?Component Value  Date  ? HGBA1C 6.0 (H) 10/26/2012  ? MPG 126 (H) 10/26/2012  ? ?BNP (last 3 results) ?Recent Labs  ?  02/20/21 ?1443  ?BNP 54.0  ? ?External labs:  ? ?Cholesterol, total 160.000 m 06/23/2020 ?HDL 38.000 mg 06/23/2020 ?LDL 94.000 mg 06/23/2020 ?Triglycerides 140.000 m 06/23/2020 ? ?TSH 1.520  06/23/2020 ? ?Medications and allergies  ? ?Allergies  ?Allergen Reactions  ? Prednisone Swelling and Other (See Comments)  ?  Makes him crazy  ? Serzone [Nefazodone Hcl] Swelling and Other (See Comments)  ?  Makes him crazy  ?  ? ?Current Outpatient Medications:  ?  ALPRAZolam (XANAX) 0.5 MG tablet, Take 0.5 mg by mouth at bedtime as needed. anxiety, Disp: , Rfl:  ?  atorvastatin (LIPITOR) 40 MG tablet, Take 40 mg by mouth daily., Disp: , Rfl:  ?  carvedilol (COREG) 6.25 MG tablet, Take 0.5 tablets (3.125 mg total) by mouth 2 (two) times daily with a meal., Disp: , Rfl:  ?  oxyCODONE (OXY IR/ROXICODONE) 5 MG immediate release tablet, Take 1 tablet by mouth as needed., Disp: , Rfl:  ?  sacubitril-valsartan (ENTRESTO) 49-51 MG, Take 0.5 tablets by mouth 2 (two) times daily., Disp: 180 tablet, Rfl: 1  ? ? ?Radiology:  ? ?No results found. ? ?Cardiac Studies:  ? ?Coronary angiogram 09/08/2012: Normal right heart catheterization, normal coronary arteries, right dominant circulation. No thoracic or abdominal aortic aneurysm. Moderate aortic regurgitation, moderate aortic stenosis. Moderate decrease in left ventricular systolic function, ejection fraction 35% ? ? AV replacement by minimally invasive surgery 10/28/2012: [facility] Ease pericardial tissue valve (size 23 mm, model # 3300TFX, serial # [id number]) ? ?Echocardiogram 05/17/2021: ?Moderately depressed LV systolic function with visual EF 35-40%. Left ventricle cavity is dilated in size with global hypokinesis. Mild left ventricular hypertrophy. Doppler evidence of grade I (impaired) diastolic dysfunction, normal LAP. ?Bioprosthetic valve is well-seated, no evidence of dehiscence, trivial perivalvular regurgitation.  ?Findings suggestive of moderate aortic stenosis. AV Pk Grad of 41 mmHg, AV Mean Grad of 27.1 mmHg, AV Pk Vel of 3.2 m/s, AVA (VTI) calculated area of 0.7 cm^2, DVI 0.2, AT . ?Mild (Grade I) valvular aortic regurgitation.  ?Compared to study  11/03/2020 LVEF improved marginally from 30-35% to 35-40%, valvular AS and AR findings are new, otherwise no significant change. ? ?EKG ? ?EKG 03/01/2020: Normal sinus rhythm at rate of 57 bpm, left atrial enlargement, left axis de

## 2021-07-03 NOTE — Interval H&P Note (Signed)
History and Physical Interval Note: ? ?07/03/2021 ?2:50 PM ? ?Bruce [name]  has presented today for surgery, with the diagnosis of aortic stenosis.  The various methods of treatment have been discussed with the patient and family. After consideration of risks, benefits and other options for treatment, the patient has consented to  Procedure(s): ?RIGHT/LEFT HEART CATH AND CORONARY ANGIOGRAPHY (N/A) as a surgical intervention.  The patient's history has been reviewed, patient examined, no change in status, stable for surgery.  I have reviewed the patient's chart and labs.  Questions were answered to the patient's satisfaction.   ? ?2012 Appropriate Use Criteria for Diagnostic Catheterization ?Home / Select Test of Interest ?Indication for RHC ?Valvular Disease ?Valvular Disease (Right and Left Heart Catheterization or Right Heart Catheterization Alone With or ?Valvular Disease ?(Right and Left Heart Catheterization or Right Heart Catheterization ?Alone With or Without Left Ventriculography and Coronary Angiography) ?Link Here: [email or web address] ?Indication: ? ?Preoperative assessment before valvular surgery ?A (7) Indication: 70; Score 7 ? ? ?Merian Wroe J Taijuan Serviss ? ? ?

## 2021-07-04 ENCOUNTER — Encounter (HOSPITAL_COMMUNITY): Payer: Self-pay | Admitting: Cardiology

## 2021-07-27 ENCOUNTER — Encounter: Payer: Self-pay | Admitting: Cardiology

## 2021-07-27 ENCOUNTER — Ambulatory Visit: Payer: BC Managed Care – PPO | Admitting: Cardiology

## 2021-07-27 VITALS — BP 102/72 | HR 75 | Temp 98.4°F | Resp 17 | Ht 72.0 in | Wt 212.6 lb

## 2021-07-27 DIAGNOSIS — Z952 Presence of prosthetic heart valve: Secondary | ICD-10-CM

## 2021-07-27 DIAGNOSIS — T8209XS Other mechanical complication of heart valve prosthesis, sequela: Secondary | ICD-10-CM

## 2021-07-27 DIAGNOSIS — I428 Other cardiomyopathies: Secondary | ICD-10-CM

## 2021-07-27 NOTE — Progress Notes (Signed)
Primary Physician/Referring:  Chilton Greathouse, MD  Patient ID: Bruce [name], male    DOB: 1971/01/26, 51 y.o.   MRN: [id number]  Chief Complaint  Patient presents with   POST CATH    2 WEEK   HPI:    Bruce [name]  is a 51 y.o. male  with history of congenital bicuspid aortic valve undergone aortic valve replacement with a bioprosthetic valve by minimally invasive approach on 10/28/2012 with implantation of a [facility] Ease 23 mm pericardial valve, and has a very small paravalvular leak  being managed medically with serial echos.   He has not been able to tolerate guideline directed medical therapy to the maximum due to low blood pressure.  Echocardiogram revealed new aortic stenosis which affect was etiology for his recent cardiomyopathy as he had presented similarly in the past.  Patient underwent right and left heart catheterization on 08/02/2021 revealing normal coronary arteries and preserved cardiac output and cardiac index.  He was evaluated by Dr. Judd Gaudier CT surgery at [facility] on 07/12/2021. No  worsening dyspnea or fatigue and states he is on appropriate medications. Still has mild dizziness occasionally with the medications but states the side effects are tolerable.   Past Medical History:  Diagnosis Date   Anxiety    Xanax prn and takes at night to sleep   Aortic regurgitation    Aortic stenosis    Arthritis    back and neck   Cardiomyopathy (HCC)    Heart murmur    bicuspid valve   History of kidney stones    Hyperlipidemia    takes Atorvastatin daily   Joint swelling    S/P minimally invasive aortic valve replacement with bioprosthetic valve 10/28/2012   17mm [facility] Ease bovine pericardial tissue valve placed via right anterior mini-thoracotomy approach   Seasonal allergies    Shortness of breath    when eating   Past Surgical History:  Procedure Laterality Date   AORTIC VALVE REPLACEMENT N/A 10/28/2012    Procedure: MINIMALLY INVASIVE AORTIC VALVE REPLACEMENT (AVR);  Surgeon: Purcell Nails, MD;  Location: [facility] OR;  Service: Open Heart Surgery;  Laterality: N/A;   cyst removed from back of spine     INTRAOPERATIVE TRANSESOPHAGEAL ECHOCARDIOGRAM N/A 10/28/2012   Procedure: INTRAOPERATIVE TRANSESOPHAGEAL ECHOCARDIOGRAM;  Surgeon: Purcell Nails, MD;  Location: [facility] OR;  Service: Open Heart Surgery;  Laterality: N/A;   LEFT AND RIGHT HEART CATHETERIZATION WITH CORONARY ANGIOGRAM N/A 09/08/2012   Procedure: LEFT AND RIGHT HEART CATHETERIZATION WITH CORONARY ANGIOGRAM;  Surgeon: Pamella Pert, MD;  Location: [facility] CATH LAB;  Service: Cardiovascular;  Laterality: N/A;   LITHOTRIPSY     right hand surgery     RIGHT HEART CATH AND CORONARY ANGIOGRAPHY N/A 07/03/2021   Procedure: RIGHT HEART CATH AND CORONARY ANGIOGRAPHY;  Surgeon: Elder Negus, MD;  Location: [facility] INVASIVE CV LAB;  Service: Cardiovascular;  Laterality: N/A;   SHOULDER SURGERY  06/2020   TEE WITHOUT CARDIOVERSION N/A 09/01/2012   Procedure: TRANSESOPHAGEAL ECHOCARDIOGRAM (TEE);  Surgeon: Pamella Pert, MD;  Location: [facility] ENDOSCOPY;  Service: Cardiovascular;  Laterality: N/A;  h&p in file-[facility]   Family History  Problem Relation Age of Onset   Cancer Mother        brain, deceased age 46     Social History   Tobacco Use   Smoking status: Former    Packs/day: 0.50    Years: 20.00    Total pack  years: 10.00    Types: Cigarettes    Quit date: 2016    Years since quitting: 7.4   Smokeless tobacco: Never  Substance Use Topics   Alcohol use: Not Currently    Alcohol/week: 1.0 - 2.0 standard drink of alcohol    Types: 1 - 2 Shots of liquor per week   Marital Status: Single  ROS  Review of Systems  Cardiovascular:  Negative for dyspnea on exertion, leg swelling and syncope.  Gastrointestinal:  Negative for melena.   Objective  Blood pressure 102/72, pulse 75, temperature 98.4 F (36.9 C), temperature source Temporal,  resp. rate 17, height 6' (1.829 m), weight 212 lb 9.6 oz (96.4 kg), SpO2 97 %.     07/27/2021    1:42 PM 07/03/2021    6:10 PM 07/03/2021    6:00 PM  Vitals with BMI  Height 6\' 0"     Weight 212 lbs 10 oz    BMI 28.83    Systolic 102    Diastolic 72    Pulse 75 67 69    Orthostatic VS for the past 72 hrs (Last 3 readings):  Patient Position BP Location Cuff Size  07/27/21 1342 Sitting Left Arm Normal      Physical Exam Vitals reviewed.  Constitutional:      General: He is not in acute distress.    Appearance: He is well-developed.  Neck:     Vascular: No carotid bruit or JVD.  Cardiovascular:     Rate and Rhythm: Normal rate and regular rhythm.     Pulses: Normal pulses and intact distal pulses.          Carotid pulses are  on the right side with bruit and  on the left side with bruit.    Heart sounds: Murmur heard.     Harsh midsystolic murmur is present with a grade of 3/6 at the upper right sternal border radiating to the neck.     No gallop.  Pulmonary:     Effort: Pulmonary effort is normal. No accessory muscle usage or respiratory distress.     Breath sounds: Normal breath sounds.  Abdominal:     General: Bowel sounds are normal.     Palpations: Abdomen is soft.  Musculoskeletal:     Right lower leg: No edema.     Left lower leg: No edema.    Laboratory examination:      Latest Ref Rng & Units 07/03/2021    3:20 PM 07/03/2021    3:16 PM 06/13/2021    2:51 PM  CMP  Glucose 70 - 99 mg/dL   459   BUN 6 - 24 mg/dL   10   Creatinine 9.77 - 1.27 mg/dL   4.14   Sodium 239 - 532 mmol/L 144  144  142   Potassium 3.5 - 5.1 mmol/L 3.8  3.8  4.1   Chloride 96 - 106 mmol/L   108   CO2 20 - 29 mmol/L   20   Calcium 8.7 - 10.2 mg/dL   9.5   Total Protein 6.0 - 8.5 g/dL   7.0   Total Bilirubin 0.0 - 1.2 mg/dL   0.4   Alkaline Phos 44 - 121 IU/L   83   AST 0 - 40 IU/L   24   ALT 0 - 44 IU/L   30       Latest Ref Rng & Units 07/03/2021    3:20 PM 07/03/2021    3:16 PM  06/13/2021    2:51 PM  CBC  WBC 3.4 - 10.8 x10E3/uL   8.8   Hemoglobin 13.0 - 17.0 g/dL 52.8  41.3  24.4   Hematocrit 39.0 - 52.0 % 45.0  45.0  47.7   Platelets 150 - 450 x10E3/uL   216    HEMOGLOBIN A1C Lab Results  Component Value Date   HGBA1C 6.0 (H) 10/26/2012   MPG 126 (H) 10/26/2012   BNP (last 3 results) Recent Labs    02/20/21 1443  BNP 54.0   External labs:   Cholesterol, total 160.000 m 06/23/2020 HDL 38.000 mg 06/23/2020 LDL 94.000 mg 06/23/2020 Triglycerides 140.000 m 06/23/2020  TSH 1.520 06/23/2020  Medications and allergies   Allergies  Allergen Reactions   Prednisone Swelling and Other (See Comments)    Makes him crazy   Serzone [Nefazodone Hcl] Swelling and Other (See Comments)    Makes him crazy     Current Outpatient Medications:    ALPRAZolam (XANAX) 0.5 MG tablet, Take 0.5 mg by mouth at bedtime. anxiety, Disp: , Rfl:    ALPRAZolam (XANAX) 0.5 MG tablet, Take 1 tablet by mouth as needed., Disp: , Rfl:    atorvastatin (LIPITOR) 40 MG tablet, Take 1 tablet by mouth daily., Disp: , Rfl:    carvedilol (COREG) 3.125 MG tablet, Take 6.25 mg by mouth daily., Disp: , Rfl:    fluticasone (FLONASE) 50 MCG/ACT nasal spray, Place 1 spray into the nose as needed., Disp: , Rfl:    ibuprofen (ADVIL) 200 MG tablet, Take 200 mg by mouth every 8 (eight) hours as needed for moderate pain., Disp: , Rfl:    oxyCODONE (OXY IR/ROXICODONE) 5 MG immediate release tablet, Take 1 tablet by mouth 2 (two) times daily as needed for severe pain., Disp: , Rfl:    sacubitril-valsartan (ENTRESTO) 49-51 MG, Take 0.5 tablets by mouth 2 (two) times daily., Disp: 180 tablet, Rfl: 1    Radiology:   No results found.  Cardiac Studies:   Coronary angiogram 09/08/2012: Normal right heart catheterization, normal coronary arteries, right dominant circulation. No thoracic or abdominal aortic aneurysm. Moderate aortic regurgitation, moderate aortic stenosis. Moderate decrease in left ventricular  systolic function, ejection fraction 35%   AV replacement by minimally invasive surgery 10/28/2012: [facility] Ease pericardial tissue valve (size 23 mm, model # 3300TFX, serial # [id number])  Right and left heart catheterization 07/03/2021:  Right dominant circulation, normal coronary arteries. Normal right heart catheterization with preserved cardiac output and cardiac index.  Echocardiogram 07/12/2021 ([facility]):  MODERATE LV DYSFUNCTION (See above) WITH MILD LVH NORMAL RIGHT VENTRICULAR SYSTOLIC FUNCTION VALVULAR REGURGITATION: TRIVIAL AR, TRIVIAL MR, TRIVIAL PR, TRIVIAL TR PROSTHETIC VALVE(S): BIOPROSTHETIC AoV Mildly reduced left ventricular systolic function, estimated EF - 42% Normal right ventricular size and function. 23mm [facility] [facility] EASE BIOPROSTHETIC AVR with mild central regurgitation, mean gradient of 23 mmHg, DI- 0.21 Compared to our office echocardiogram done on 05/17/2021, LVEF is estimated at 35 to 40% with mildly dilated LV, aortic valve peak gradient of 41 and mean gradient of 27 mmHg, aortic valve area 0.7 cm, dimensionless index 0.2.  EKG  EKG 03/01/2020: Normal sinus rhythm at rate of 57 bpm, left atrial enlargement, left axis deviation, left anterior fascicular block.  IVCD, borderline criteria for LVH.  No significant change from 07/28/2020. . Assessment     ICD-10-CM   1. Prosthetic valve dysfunction, sequela  T82.09XS     2. Aortic valve prosthesis present  Z95.2     3. Non-ischemic  cardiomyopathy (HCC)  I42.8       No orders of the defined types were placed in this encounter.   Recommendations:   TAJUAN DUFAULT  is a 51 y.o. male  with history of congenital bicuspid aortic valve undergone aortic valve replacement with a bioprosthetic valve by minimally invasive approach on 10/28/2012 with implantation of a [facility] Ease 23 mm pericardial valve, and has a very small paravalvular leak  being managed medically with serial echos.   Patient's symptoms  of decreased exercise capacity started sometime in the fall 2022, echocardiogram at that time it revealed LVEF of 30 to 35% without significant aortic valve stenosis.  Initially felt to be nonischemic on medical therapy, repeat echocardiogram on 05/17/2021 revealed improvement in LVEF to 35 to 40% along with findings of moderate aortic valve stenosis.  This was a new finding.  He underwent cardiac catheterization on 07/03/2021 which revealed normal coronary arteries and preserved cardiac output and cardiac index.  He is now being evaluated at [facility] for redo aortic valve replacement and he is seeing Dr. Bevelyn Ngo.  Dr. Randolm Idol, MD at [facility] made the switch to Dr. Imogene Burn.  I reviewed the echocardiogram from [facility], his LVEF has improved on medical therapy.  He still continues to have borderline low blood pressure, no further room for uptitrating his Entresto or carvedilol.  Patient is also skeptical on doing this.  Continue present medications, I will see him back in 3 months for close monitoring.    Yates Decamp, MD, [facility] 07/27/2021, 10:37 PM Office: [phone] Fax: [phone] Pager: [phone]          CC:  Dr. Bevelyn Ngo.

## 2021-08-01 ENCOUNTER — Other Ambulatory Visit: Payer: Self-pay | Admitting: Thoracic Surgery (Cardiothoracic Vascular Surgery)

## 2021-08-01 ENCOUNTER — Other Ambulatory Visit (HOSPITAL_COMMUNITY): Payer: Self-pay | Admitting: Thoracic Surgery (Cardiothoracic Vascular Surgery)

## 2021-08-01 DIAGNOSIS — T8203XD Leakage of heart valve prosthesis, subsequent encounter: Secondary | ICD-10-CM

## 2021-08-01 DIAGNOSIS — Z952 Presence of prosthetic heart valve: Secondary | ICD-10-CM

## 2021-08-08 ENCOUNTER — Ambulatory Visit (HOSPITAL_COMMUNITY)
Admission: RE | Admit: 2021-08-08 | Discharge: 2021-08-08 | Disposition: A | Discharge status: Home / Self Care | Payer: BC Managed Care – PPO | Source: Ambulatory Visit | Attending: Thoracic Surgery (Cardiothoracic Vascular Surgery) | Admitting: Thoracic Surgery (Cardiothoracic Vascular Surgery)

## 2021-08-08 ENCOUNTER — Ambulatory Visit (HOSPITAL_COMMUNITY): Payer: BC Managed Care – PPO

## 2021-08-08 DIAGNOSIS — Z952 Presence of prosthetic heart valve: Secondary | ICD-10-CM | POA: Diagnosis not present

## 2021-08-08 DIAGNOSIS — T8203XD Leakage of heart valve prosthesis, subsequent encounter: Secondary | ICD-10-CM | POA: Insufficient documentation

## 2021-08-08 MED ORDER — IOHEXOL 350 MG/ML SOLN
100.0000 mL | Freq: Once | INTRAVENOUS | Status: AC | PRN
  Administered 2021-08-08: 100 mL via INTRAVENOUS

## 2021-08-15 ENCOUNTER — Other Ambulatory Visit (HOSPITAL_COMMUNITY): Payer: BC Managed Care – PPO

## 2021-08-16 ENCOUNTER — Ambulatory Visit: Payer: BC Managed Care – PPO | Admitting: Cardiology

## 2021-08-31 ENCOUNTER — Ambulatory Visit: Payer: BC Managed Care – PPO | Admitting: Cardiology

## 2021-10-08 ENCOUNTER — Encounter: Payer: Self-pay | Admitting: Cardiology

## 2021-10-08 NOTE — Telephone Encounter (Signed)
From pt

## 2021-10-15 HISTORY — PX: AORTIC VALVE REPLACEMENT: SHX41

## 2021-10-16 ENCOUNTER — Other Ambulatory Visit: Payer: Self-pay | Admitting: Cardiology

## 2021-10-16 DIAGNOSIS — Z952 Presence of prosthetic heart valve: Secondary | ICD-10-CM

## 2021-10-16 HISTORY — DX: Presence of prosthetic heart valve: Z95.2

## 2021-10-23 ENCOUNTER — Ambulatory Visit: Payer: BC Managed Care – PPO | Admitting: Cardiology

## 2021-10-23 ENCOUNTER — Telehealth: Payer: Self-pay

## 2021-10-23 ENCOUNTER — Other Ambulatory Visit: Payer: Self-pay

## 2021-10-23 DIAGNOSIS — Z952 Presence of prosthetic heart valve: Secondary | ICD-10-CM

## 2021-10-23 DIAGNOSIS — Z7901 Long term (current) use of anticoagulants: Secondary | ICD-10-CM | POA: Insufficient documentation

## 2021-10-23 LAB — POCT INR: INR: 4.5 — AB (ref 2.0–3.0)

## 2021-10-23 MED ORDER — WARFARIN SODIUM 2 MG PO TABS: 2.0000 mg | ORAL_TABLET | Freq: Every day | ORAL | 0 refills | Status: DC

## 2021-10-23 NOTE — Progress Notes (Signed)
ICD-10-CM   1. Long term (current) use of anticoagulants  Z79.01 POCT INR    2. H/O mechanical aortic valve replacement: [name] and (27 mm [employer] [facility]) AV replacemnt on 10/16/21   Z95.2

## 2021-10-23 NOTE — Telephone Encounter (Signed)
As discussed Please enter results. I spoke to the patient also

## 2021-10-29 ENCOUNTER — Ambulatory Visit: Payer: BC Managed Care – PPO | Admitting: Cardiology

## 2021-10-29 DIAGNOSIS — I5022 Chronic systolic (congestive) heart failure: Secondary | ICD-10-CM

## 2021-10-29 DIAGNOSIS — Z952 Presence of prosthetic heart valve: Secondary | ICD-10-CM

## 2021-10-29 LAB — POCT INR: INR: 1.7 — AB (ref 2.0–3.0)

## 2021-10-29 NOTE — Progress Notes (Signed)
ICD-10-CM   1. H/O mechanical aortic valve replacement: [name] and (27 mm [employer]) AV replacemnt on 10/16/21   Z95.2     2. Chronic systolic heart failure (HCC)  T05.69 POCT INR    CBC    Basic metabolic panel    Brain natriuretic peptide     Orders Placed This Encounter  Procedures   CBC   Basic metabolic panel   Brain natriuretic peptide   POCT INR    Description   Patients INR was NOT at goal today, 1.7. Goal INR 2-3. Patient is currently on 2mg  daily. Changes made today are as follows: 3mg  Mon, Thurs and 2mg  all other days. Patient already has a scheduled appointment for 11/01/2021.  Lab Results      Component                Value               Date                      INR                      1.7 (A)             10/29/2021                INR                      4.5 (A)             10/23/2021                INR                      1.42                10/28/2012                No orders of the defined types were placed in this encounter.   Patient has bilateral leg edema and had gained about 20 to 25 pounds in weight since valve repair/replacement, is presently on furosemide 40 mg twice daily, advised him to reduce it to once a day.  He has an appointment to see me back in 3 days and will recheck his INR and also have office visit.  Ordered labs.  Discussed findings with his partner 12/29/2021.   12/23/2021, MD, [facility] 10/29/2021, 9:16 PM Office: [phone] Fax: [phone] Pager: [phone]

## 2021-10-30 LAB — BASIC METABOLIC PANEL
BUN/Creatinine Ratio: 11 (ref 9–20)
BUN: 10 mg/dL (ref 6–24)
CO2: 25 mmol/L (ref 20–29)
Calcium: 8.4 mg/dL — ABNORMAL LOW (ref 8.7–10.2)
Chloride: 103 mmol/L (ref 96–106)
Creatinine, Ser: 0.9 mg/dL (ref 0.76–1.27)
Glucose: 118 mg/dL — ABNORMAL HIGH (ref 70–99)
Potassium: 4.7 mmol/L (ref 3.5–5.2)
Sodium: 141 mmol/L (ref 134–144)
eGFR: 103 mL/min/{1.73_m2} (ref >59)

## 2021-10-30 LAB — CBC
Hematocrit: 28.2 % — ABNORMAL LOW (ref 37.5–51.0)
Hemoglobin: 8.9 g/dL — ABNORMAL LOW (ref 13.0–17.7)
MCH: 27.7 pg (ref 26.6–33.0)
MCHC: 31.6 g/dL (ref 31.5–35.7)
MCV: 88 fL (ref 79–97)
Platelets: 512 10*3/uL — ABNORMAL HIGH (ref 150–450)
RBC: 3.21 x10E6/uL — ABNORMAL LOW (ref 4.14–5.80)
RDW: 14.5 % (ref 11.6–15.4)
WBC: 12.4 10*3/uL — ABNORMAL HIGH (ref 3.4–10.8)

## 2021-10-30 LAB — BRAIN NATRIURETIC PEPTIDE: BNP: 304 pg/mL — ABNORMAL HIGH (ref 0.0–100.0)

## 2021-11-01 ENCOUNTER — Ambulatory Visit: Payer: BC Managed Care – PPO | Admitting: Cardiology

## 2021-11-01 ENCOUNTER — Encounter: Payer: Self-pay | Admitting: Cardiology

## 2021-11-01 VITALS — BP 108/62 | HR 86 | Temp 98.1°F | Resp 16 | Ht 72.0 in | Wt 221.8 lb

## 2021-11-01 DIAGNOSIS — G4701 Insomnia due to medical condition: Secondary | ICD-10-CM

## 2021-11-01 DIAGNOSIS — Z7901 Long term (current) use of anticoagulants: Secondary | ICD-10-CM

## 2021-11-01 DIAGNOSIS — Z298 Encounter for other specified prophylactic measures: Secondary | ICD-10-CM

## 2021-11-01 DIAGNOSIS — Z952 Presence of prosthetic heart valve: Secondary | ICD-10-CM

## 2021-11-01 DIAGNOSIS — I5022 Chronic systolic (congestive) heart failure: Secondary | ICD-10-CM

## 2021-11-01 LAB — POCT INR: INR: 1.7 — AB (ref 2.0–3.0)

## 2021-11-01 MED ORDER — VALSARTAN 80 MG PO TABS: 80.0000 mg | ORAL_TABLET | Freq: Every evening | ORAL | 2 refills | Status: DC

## 2021-11-01 MED ORDER — WARFARIN SODIUM 2.5 MG PO TABS: 2.5000 mg | ORAL_TABLET | Freq: Every day | ORAL | 3 refills | Status: DC

## 2021-11-01 MED ORDER — ZOLPIDEM TARTRATE 10 MG PO TABS: 10.0000 mg | ORAL_TABLET | Freq: Every evening | ORAL | 0 refills | Status: DC | PRN

## 2021-11-01 NOTE — Progress Notes (Signed)
Primary Physician/Referring:  Chilton Greathouse, MD  Patient ID: Bruce [name], male    DOB: 1970/04/09, 51 y.o.   MRN: [id number]  Chief Complaint  Patient presents with   Congestive Heart Failure   Follow-up    3 months   HPI:    BRINDEN KINCHELOE  is a 51 y.o. male  with history of congenital bicuspid aortic valve undergone aortic valve replacement with a bioprosthetic valve by minimally invasive approach on 10/28/2012 with implantation of a [facility] Ease 23 mm pericardial valve.  Patient's symptoms of decreased exercise capacity started sometime in the fall 2022, echocardiogram at that time it revealed LVEF of 35% along with findings of moderate aortic valve stenosis.  He underwent complex redo mechanical aortic valve replacement with 27 mm Saint Jude Regent valve along with aortic root replacement with [name] on 10/16/2021.  He is presently recuperating from surgery well, had significant leg edema and on last visit 4 days ago for INR management, I had reduced furosemide dose to once a day.  States that his leg edema has resolved.  Is gradually improving in strength.  No PND or orthopnea.  Past Medical History:  Diagnosis Date   Anxiety    Xanax prn and takes at night to sleep   Aortic regurgitation    Aortic stenosis    Arthritis    back and neck   Cardiomyopathy (HCC)    Heart murmur    bicuspid valve   History of kidney stones    Hyperlipidemia    takes Atorvastatin daily   Joint swelling    Re-Do mechanical aortic valve replacement: [name] and (27 mm St. Jude Regent) AV replacemnt on 10/16/21 10/16/2021   S/P minimally invasive aortic valve replacement with bioprosthetic valve 10/28/2012   48mm [employer] Magna Ease bovine pericardial tissue valve placed via right anterior mini-thoracotomy approach   Seasonal allergies    Shortness of breath    when eating   Past Surgical History:  Procedure Laterality Date   AORTIC VALVE REPLACEMENT N/A  10/28/2012   Procedure: MINIMALLY INVASIVE AORTIC VALVE REPLACEMENT (AVR);  Surgeon: Purcell Nails, MD;  Location: [facility] OR;  Service: Open Heart Surgery;  Laterality: N/A;   cyst removed from back of spine     INTRAOPERATIVE TRANSESOPHAGEAL ECHOCARDIOGRAM N/A 10/28/2012   Procedure: INTRAOPERATIVE TRANSESOPHAGEAL ECHOCARDIOGRAM;  Surgeon: Purcell Nails, MD;  Location: [facility] OR;  Service: Open Heart Surgery;  Laterality: N/A;   LEFT AND RIGHT HEART CATHETERIZATION WITH CORONARY ANGIOGRAM N/A 09/08/2012   Procedure: LEFT AND RIGHT HEART CATHETERIZATION WITH CORONARY ANGIOGRAM;  Surgeon: Pamella Pert, MD;  Location: [facility] CATH LAB;  Service: Cardiovascular;  Laterality: N/A;   LITHOTRIPSY     right hand surgery     RIGHT HEART CATH AND CORONARY ANGIOGRAPHY N/A 07/03/2021   Procedure: RIGHT HEART CATH AND CORONARY ANGIOGRAPHY;  Surgeon: Elder Negus, MD;  Location: [facility] INVASIVE CV LAB;  Service: Cardiovascular;  Laterality: N/A;   SHOULDER SURGERY  06/2020   TEE WITHOUT CARDIOVERSION N/A 09/01/2012   Procedure: TRANSESOPHAGEAL ECHOCARDIOGRAM (TEE);  Surgeon: Pamella Pert, MD;  Location: [facility] ENDOSCOPY;  Service: Cardiovascular;  Laterality: N/A;  h&p in file-[facility]   Family History  Problem Relation Age of Onset   Cancer Mother        brain, deceased age 39     Social History   Tobacco Use   Smoking status: Former    Packs/day: 0.50    Years:  20.00    Total pack years: 10.00    Types: Cigarettes    Quit date: 2016    Years since quitting: 7.7   Smokeless tobacco: Never  Substance Use Topics   Alcohol use: Not Currently    Alcohol/week: 1.0 - 2.0 standard drink of alcohol    Types: 1 - 2 Shots of liquor per week   Marital Status: Single  ROS  Review of Systems  Cardiovascular:  Positive for chest pain (Surgical site). Negative for dyspnea on exertion, leg swelling and syncope.  Gastrointestinal:  Negative for melena.   Objective  Blood pressure 108/62, pulse 86,  temperature 98.1 F (36.7 C), temperature source Temporal, resp. rate 16, height 6' (1.829 m), weight 221 lb 12.8 oz (100.6 kg), SpO2 97 %.     11/01/2021    1:51 PM 07/27/2021    1:42 PM 07/03/2021    6:10 PM  Vitals with BMI  Height 6\' 0"  6\' 0"    Weight 221 lbs 13 oz 212 lbs 10 oz   BMI 30.07 28.83   Systolic 108 102   Diastolic 62 72   Pulse 86 75 67    Orthostatic VS for the past 72 hrs (Last 3 readings):  Patient Position BP Location Cuff Size  11/01/21 1351 Sitting Left Arm Large      Physical Exam Vitals reviewed.  Constitutional:      Appearance: He is well-developed.  Neck:     Vascular: No carotid bruit or JVD.  Cardiovascular:     Rate and Rhythm: Normal rate and regular rhythm.     Pulses: Normal pulses and intact distal pulses.          Carotid pulses are  on the right side with bruit and  on the left side with bruit.    Heart sounds: Normal heart sounds. No murmur heard.    No gallop.  Pulmonary:     Effort: Pulmonary effort is normal. No accessory muscle usage or respiratory distress.     Breath sounds: Normal breath sounds.  Abdominal:     General: Bowel sounds are normal.     Palpations: Abdomen is soft.  Musculoskeletal:     Right lower leg: No edema.     Left lower leg: No edema.    Laboratory examination:      Latest Ref Rng & Units 10/29/2021   11:48 AM 07/03/2021    3:20 PM 07/03/2021    3:16 PM  CMP  Glucose 70 - 99 mg/dL 07/05/2021     BUN 6 - 24 mg/dL 10     Creatinine 07/05/2021 - 1.27 mg/dL 025     Sodium 8.52 - 7.78 mmol/L 141  144  144   Potassium 3.5 - 5.2 mmol/L 4.7  3.8  3.8   Chloride 96 - 106 mmol/L 103     CO2 20 - 29 mmol/L 25     Calcium 8.7 - 10.2 mg/dL 8.4         Latest Ref Rng & Units 10/29/2021   11:48 AM 07/03/2021    3:20 PM 07/03/2021    3:16 PM  CBC  WBC 3.4 - 10.8 x10E3/uL 12.4     Hemoglobin 13.0 - 17.7 g/dL 8.9  07/05/2021  07/05/2021   Hematocrit 37.5 - 51.0 % 28.2  45.0  45.0   Platelets 150 - 450 x10E3/uL 512      HEMOGLOBIN  A1C Lab Results  Component Value Date   HGBA1C 6.0 (H) 10/26/2012  MPG 126 (H) 10/26/2012   BNP (last 3 results) Recent Labs    02/20/21 1443 10/29/21 1148  BNP 54.0 304.0*   External labs:   Cholesterol, total 160.000 m 06/23/2020 HDL 38.000 mg 06/23/2020 LDL 94.000 mg 06/23/2020 Triglycerides 140.000 m 06/23/2020  TSH 1.520 06/23/2020  Medications and allergies   Allergies  Allergen Reactions   Prednisone Swelling and Other (See Comments)    Makes him crazy   Serzone [Nefazodone Hcl] Swelling and Other (See Comments)    Makes him crazy     Current Outpatient Medications:    ALPRAZolam (XANAX) 0.5 MG tablet, Take 1 tablet by mouth as needed., Disp: , Rfl:    aspirin 81 MG chewable tablet, Chew 1 tablet by mouth daily., Disp: , Rfl:    atorvastatin (LIPITOR) 40 MG tablet, Take 1 tablet by mouth daily., Disp: , Rfl:    carvedilol (COREG) 6.25 MG tablet, Take 6.25 mg by mouth daily., Disp: , Rfl:    fluticasone (FLONASE) 50 MCG/ACT nasal spray, Place 1 spray into the nose as needed., Disp: , Rfl:    furosemide (LASIX) 40 MG tablet, Take 40 mg by mouth 2 (two) times daily., Disp: , Rfl:    gabapentin (NEURONTIN) 100 MG capsule, Take 100 mg by mouth 3 (three) times daily., Disp: , Rfl:    ibuprofen (ADVIL) 200 MG tablet, Take 200 mg by mouth every 8 (eight) hours as needed for moderate pain., Disp: , Rfl:    methocarbamol (ROBAXIN) 500 MG tablet, Take 500 mg by mouth 3 (three) times daily as needed., Disp: , Rfl:    oxyCODONE (OXY IR/ROXICODONE) 5 MG immediate release tablet, Take 1 tablet by mouth 2 (two) times daily as needed for severe pain., Disp: , Rfl:    potassium chloride (KLOR-CON) 10 MEQ tablet, Take 10 mEq by mouth daily., Disp: , Rfl:    valsartan (DIOVAN) 80 MG tablet, Take 1 tablet (80 mg total) by mouth every evening., Disp: 30 tablet, Rfl: 2   warfarin (COUMADIN) 2 MG tablet, Take 1 tablet (2 mg total) by mouth daily. (Patient taking differently: Take 2 mg by mouth as  directed. Daily as per Protime/INR), Disp: 30 tablet, Rfl: 0   warfarin (COUMADIN) 2.5 MG tablet, Take 1 tablet (2.5 mg total) by mouth daily., Disp: 90 tablet, Rfl: 3   zolpidem (AMBIEN) 10 MG tablet, Take 1 tablet (10 mg total) by mouth at bedtime as needed for sleep., Disp: 30 tablet, Rfl: 0    Radiology:   No results found.  Cardiac Studies:    AV replacement by minimally invasive surgery 10/28/2012: [facility] Ease pericardial tissue valve (size 23 mm, model # [id number], serial # [id number])  Right and left heart catheterization 07/03/2021:  Right dominant circulation, normal coronary arteries. Normal right heart catheterization with preserved cardiac output and cardiac index.  Left Heart Catheterization 07/03/21:  Angiographically normal coronary arteries Normal right sided filling pressures Unable to cross LV  Echocardiogram 07/12/2021 ([facility]):  MODERATE LV DYSFUNCTION (See above) WITH MILD LVH NORMAL RIGHT VENTRICULAR SYSTOLIC FUNCTION VALVULAR REGURGITATION: TRIVIAL AR, TRIVIAL MR, TRIVIAL PR, TRIVIAL TR PROSTHETIC VALVE(S): BIOPROSTHETIC AoV Mildly reduced left ventricular systolic function, estimated EF - 42% Normal right ventricular size and function. 41mm [employer]WARDS MAGNA EASE BIOPROSTHETIC AVR with mild central regurgitation, mean gradient of 23 mmHg, DI- 0.21 Compared to our office echocardiogram done on 05/17/2021, LVEF is estimated at 35 to 40% with mildly dilated LV, aortic valve peak gradient of 41 and mean gradient of  27 mmHg, aortic valve area 0.7 cm, dimensionless index 0.2.  EKG EKG 11/01/2021: Normal sinus rhythm at rate of 82 bpm, left axis deviation, left anteriorfascicular block.  IVCD, borderline criteria for LVH.  Low-voltage complexes.  Nonspecific T abnormality.  No change from 03/01/2020  . Assessment     ICD-10-CM   1. Chronic systolic heart failure (HCC)  A63.01 EKG 12-Lead    valsartan (DIOVAN) 80 MG tablet    2. H/O mechanical aortic valve  replacement: [name] and (27 mm St. Jude Regent) AV replacemnt on 10/16/21   Z95.2 PCV ECHOCARDIOGRAM COMPLETE    warfarin (COUMADIN) 2.5 MG tablet    3. Long term (current) use of anticoagulants  Z79.01 warfarin (COUMADIN) 2.5 MG tablet    POCT INR    4. Insomnia due to medical condition  G47.01 zolpidem (AMBIEN) 10 MG tablet    5. Indication present for endocarditis prophylaxis  Z29.8       Orders Placed This Encounter  Procedures   POCT INR   EKG 12-Lead   PCV ECHOCARDIOGRAM COMPLETE    Standing Status:   Future    Standing Expiration Date:   11/02/2022   Meds ordered this encounter  Medications   zolpidem (AMBIEN) 10 MG tablet    Sig: Take 1 tablet (10 mg total) by mouth at bedtime as needed for sleep.    Dispense:  30 tablet    Refill:  0   valsartan (DIOVAN) 80 MG tablet    Sig: Take 1 tablet (80 mg total) by mouth every evening.    Dispense:  30 tablet    Refill:  2   warfarin (COUMADIN) 2.5 MG tablet    Sig: Take 1 tablet (2.5 mg total) by mouth daily.    Dispense:  90 tablet    Refill:  3    Recommendations:   JAMAS JAQUAY  is a 51 y.o. male  with history of congenital bicuspid aortic valve undergone aortic valve replacement with a bioprosthetic valve by minimally invasive approach on 10/28/2012 with implantation of a [facility] Ease 23 mm pericardial valve.  Patient's symptoms of decreased exercise capacity started sometime in the fall 2022, echocardiogram at that time it revealed LVEF of 35% along with findings of moderate aortic valve stenosis.  He underwent complex redo mechanical aortic valve replacement with 27 mm Saint Jude Regent valve along with aortic root replacement with [name] on 10/16/2021.  He is presently recuperating from surgery well, had significant leg edema and on last visit 4 days ago for INR management, I had reduced furosemide dose to once a day.  He does not appear to be volume overloaded anymore.  Advised him to  take furosemide only if he has weight gain of >3 pounds in 2 days or significant leg edema or dyspnea.  Patient previously was on moderate dose Entresto for chronic systolic heart failure.  As his blood pressure is soft, I will initiate him on valsartan 80 mg, starting with 40 mg in the evening and titrating it up as tolerated.  I will also repeat echocardiogram to establish a new baseline.  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  Since surgery, he has been having difficulty with sleep.  I prescribed him Ambien as trazodone had not previously worked for him.  Patient is aware of need for endocarditis prophylaxis.  I reviewed his INR today and he will continue with Coumadin clinic, goal  INR 2-3.  Office visit in 4 weeks.  This was a 40-minute office visit encounter with review of external records, management of medical issues of high complexity.  Description   Patients INR was NOT at goal today, 1.7. Goal INR 2-3. Patient is currently on 3 mg Mon, Thurs and 2 mg all other days.   Changes made today are as follows: 3mg  Thurs, Fri, Sat for 3 days only and then 2.5 mg daily (7 days a week). Patient already has a scheduled appointment for 11/12/21.  Lab Results      Component                Value               Date                      INR                      1.7 (A)             10/29/2021                INR                      4.5 (A)             10/23/2021                INR                      1.42                10/28/2012                  12/28/2012, MD, [facility] 11/01/2021, 10:03 PM Office: [phone] Fax: [phone] Pager: [phone]

## 2021-11-12 ENCOUNTER — Ambulatory Visit: Payer: BC Managed Care – PPO | Admitting: Cardiology

## 2021-11-12 DIAGNOSIS — Z952 Presence of prosthetic heart valve: Secondary | ICD-10-CM

## 2021-11-12 DIAGNOSIS — Z7901 Long term (current) use of anticoagulants: Secondary | ICD-10-CM

## 2021-11-12 DIAGNOSIS — I5022 Chronic systolic (congestive) heart failure: Secondary | ICD-10-CM

## 2021-11-12 LAB — POCT INR: INR: 1.4 — AB (ref 2.0–3.0)

## 2021-11-13 NOTE — Progress Notes (Signed)
Description   11/12/2021   INR today was 1.7. Patient goal level is (2.0-3.0) Prior dose was 2.5 daily. patient will now start back on 2.5 mg Sunday,Tuesday and Thursday, and 3 mg all the other days.  Pt will recheck INR 1 weeks on 10/2.  Lab Results      Component                Value               Date                      INR                      1.7 (A)             11/01/2021                INR                      1.7 (A)             10/29/2021                INR                      4.5 (A)             09 /06/2021                   Patient complaint of elevated heart rate, states that his heart rate has been around 95 to 110 bpm.  Auscultated the patient, he has very irregular heartbeat, suspect elevated heart rate due to postop anemia.  Lungs were clear, there is no JVD or leg edema.  Reassured him.

## 2021-11-17 ENCOUNTER — Other Ambulatory Visit: Payer: Self-pay | Admitting: Cardiology

## 2021-11-19 ENCOUNTER — Ambulatory Visit: Payer: BC Managed Care – PPO | Admitting: Cardiology

## 2021-11-19 DIAGNOSIS — Z952 Presence of prosthetic heart valve: Secondary | ICD-10-CM

## 2021-11-19 LAB — POCT INR: INR: 1.5 — AB (ref 2.0–3.0)

## 2021-11-19 NOTE — Progress Notes (Signed)
Description   11/19/2021   INR today was 1.5. Patient goal level is (2-3)  Prior dose was 2.5 mg Sunday, Tuesday, and Thursday and 3 mg all the other days.  Patient will now start on 3 mg daily. Patient will take 6mg only today   Pt will recheck INR 1 weeks on 10/13.  H/O mechanical aortic valve replacement  (primary encounter diagnosis) Plan: POCT INR    Lab Results      Component                Value               Date                      INR                      1.5 (A)             11/19/2021                INR                      1.4 (A)             11/12/2021                INR                      1.7 (A)             09 /14/2023

## 2021-11-30 ENCOUNTER — Other Ambulatory Visit: Payer: BC Managed Care – PPO

## 2021-12-03 ENCOUNTER — Encounter: Payer: Self-pay | Admitting: Cardiology

## 2021-12-03 ENCOUNTER — Ambulatory Visit: Payer: BC Managed Care – PPO

## 2021-12-03 ENCOUNTER — Ambulatory Visit: Payer: BC Managed Care – PPO | Admitting: Cardiology

## 2021-12-03 DIAGNOSIS — I359 Nonrheumatic aortic valve disorder, unspecified: Secondary | ICD-10-CM

## 2021-12-03 DIAGNOSIS — Z952 Presence of prosthetic heart valve: Secondary | ICD-10-CM

## 2021-12-03 LAB — POCT INR: INR: 1.6 — AB (ref 2.0–3.0)

## 2021-12-03 NOTE — Progress Notes (Signed)
Description   12/03/2021    INR today was 1.6. Patient goal level is (2-3)  Prior dose was 3 mg daily  Patient will now start on 6 mg Mon, Wed and Friday and  3mg  all other days   Pt will recheck INR 1 weeks on 10/23.  H/O mechanical aortic valve replacement  (primary encounter diagnosis) Plan: POCT INR    Lab Results      Component                Value               Date                      INR                      1.6 (A)             12/03/2021                INR                      1.5 (A)             11/19/2021                INR                      1.4 (A)             11/12/2021

## 2021-12-12 ENCOUNTER — Other Ambulatory Visit: Payer: Self-pay | Admitting: Cardiology

## 2021-12-14 ENCOUNTER — Ambulatory Visit: Payer: BC Managed Care – PPO

## 2021-12-14 DIAGNOSIS — Z5181 Encounter for therapeutic drug level monitoring: Secondary | ICD-10-CM

## 2021-12-14 DIAGNOSIS — Z952 Presence of prosthetic heart valve: Secondary | ICD-10-CM

## 2021-12-14 DIAGNOSIS — Z7901 Long term (current) use of anticoagulants: Secondary | ICD-10-CM

## 2021-12-14 LAB — POCT INR: INR: 2.4 (ref 2.0–3.0)

## 2021-12-14 NOTE — Progress Notes (Signed)
Description   12/14/2021    INR today was 2.4. Patient goal level is (2.0-3.0)  Prior dose was 6 mg Monday, Wednesday, Friday, and 3 mg all the other days.  patient will continue  6 mg Monday, Wednesday, Friday, and 3 mg all the other days.  Pt will recheck INR 4 weeks on 01/18/2022.   Lab Results      Component                Value               Date                      INR                      2.4                 12/14/2021                INR                      1.6 (A)             12/03/2021                INR                      1.5 (A)             11/19/2021             H/O mechanical aortic valve replacement  (primary encounter diagnosis) Plan: POCT INR

## 2021-12-17 ENCOUNTER — Ambulatory Visit: Payer: BC Managed Care – PPO | Admitting: Cardiology

## 2021-12-21 ENCOUNTER — Other Ambulatory Visit: Payer: Self-pay

## 2021-12-21 ENCOUNTER — Ambulatory Visit: Payer: BC Managed Care – PPO

## 2021-12-21 ENCOUNTER — Ambulatory Visit: Payer: Self-pay

## 2021-12-21 VITALS — BP 111/68 | HR 83 | Resp 16 | Ht 72.0 in | Wt 214.0 lb

## 2021-12-21 DIAGNOSIS — Z5181 Encounter for therapeutic drug level monitoring: Secondary | ICD-10-CM

## 2021-12-21 DIAGNOSIS — Z2989 Encounter for other specified prophylactic measures: Secondary | ICD-10-CM

## 2021-12-21 DIAGNOSIS — Z7901 Long term (current) use of anticoagulants: Secondary | ICD-10-CM

## 2021-12-21 DIAGNOSIS — Z952 Presence of prosthetic heart valve: Secondary | ICD-10-CM

## 2021-12-21 DIAGNOSIS — I5020 Unspecified systolic (congestive) heart failure: Secondary | ICD-10-CM

## 2021-12-21 LAB — POCT INR: INR: 2.2 (ref 2.0–3.0)

## 2021-12-21 NOTE — Progress Notes (Signed)
Primary Physician/Referring:  Chilton Greathouse, MD  Patient ID: Bruce [name], male    DOB: Jan 05, 1971, 51 y.o.   MRN: [id number]  Chief Complaint  Patient presents with   Follow-up    AVR INR check   HPI:    Bruce [name]  is a 51 y.o. male  with history of congenital bicuspid aortic valve undergone aortic valve replacement with a bioprosthetic valve by minimally invasive approach on 10/28/2012 with implantation of a [facility] Ease 23 mm pericardial valve.  Patient's symptoms of decreased exercise capacity started sometime in the fall 2022, echocardiogram at that time it revealed LVEF of 35% along with findings of moderate aortic valve stenosis. He underwent complex redo mechanical aortic valve replacement with 27 mm Saint Jude Regent valve along with aortic root replacement with [name] on 10/16/2021.  He presents today for 4 week follow-up and INR check.  Overall, he is doing well with any complaints. She still has some chest tenderness during exercise over incision sites. He is participating in cardiac rehab. He denies chest pain, shortness of breath, palpitations, leg edema, orthopnea, PND, TIA/syncope.  Past Medical History:  Diagnosis Date   Anxiety    Xanax prn and takes at night to sleep   Aortic regurgitation    Aortic stenosis    Arthritis    back and neck   Cardiomyopathy (HCC)    Heart murmur    bicuspid valve   History of kidney stones    Hyperlipidemia    takes Atorvastatin daily   Joint swelling    Re-Do mechanical aortic valve replacement: [name] and (27 mm St. Jude Regent) AV replacemnt on 10/16/21 10/16/2021   S/P minimally invasive aortic valve replacement with bioprosthetic valve 10/28/2012   45mm [employer] Magna Ease bovine pericardial tissue valve placed via right anterior mini-thoracotomy approach   Seasonal allergies    Shortness of breath    when eating   Past Surgical History:  Procedure Laterality Date   AORTIC  VALVE REPLACEMENT N/A 10/28/2012   Procedure: MINIMALLY INVASIVE AORTIC VALVE REPLACEMENT (AVR);  Surgeon: Purcell Nails, MD;  Location: [facility] OR;  Service: Open Heart Surgery;  Laterality: N/A;   cyst removed from back of spine     INTRAOPERATIVE TRANSESOPHAGEAL ECHOCARDIOGRAM N/A 10/28/2012   Procedure: INTRAOPERATIVE TRANSESOPHAGEAL ECHOCARDIOGRAM;  Surgeon: Purcell Nails, MD;  Location: [facility] OR;  Service: Open Heart Surgery;  Laterality: N/A;   LEFT AND RIGHT HEART CATHETERIZATION WITH CORONARY ANGIOGRAM N/A 09/08/2012   Procedure: LEFT AND RIGHT HEART CATHETERIZATION WITH CORONARY ANGIOGRAM;  Surgeon: Pamella Pert, MD;  Location: [facility] CATH LAB;  Service: Cardiovascular;  Laterality: N/A;   LITHOTRIPSY     right hand surgery     RIGHT HEART CATH AND CORONARY ANGIOGRAPHY N/A 07/03/2021   Procedure: RIGHT HEART CATH AND CORONARY ANGIOGRAPHY;  Surgeon: Elder Negus, MD;  Location: [facility] INVASIVE CV LAB;  Service: Cardiovascular;  Laterality: N/A;   SHOULDER SURGERY  06/2020   TEE WITHOUT CARDIOVERSION N/A 09/01/2012   Procedure: TRANSESOPHAGEAL ECHOCARDIOGRAM (TEE);  Surgeon: Pamella Pert, MD;  Location: [facility] ENDOSCOPY;  Service: Cardiovascular;  Laterality: N/A;  h&p in file-[facility]   Family History  Problem Relation Age of Onset   Cancer Mother        brain, deceased age 26     Social History   Tobacco Use   Smoking status: Former    Packs/day: 0.50    Years: 20.00    Total  pack years: 10.00    Types: Cigarettes    Quit date: 2016    Years since quitting: 7.8   Smokeless tobacco: Never  Substance Use Topics   Alcohol use: Not Currently    Alcohol/week: 1.0 - 2.0 standard drink of alcohol    Types: 1 - 2 Shots of liquor per week   Marital Status: Single  ROS  Review of Systems  Cardiovascular:  Positive for chest pain (Surgical site). Negative for dyspnea on exertion, leg swelling and syncope.  Gastrointestinal:  Negative for melena.   Objective  Blood pressure  111/68, pulse 83, resp. rate 16, height 6' (1.829 m), weight 214 lb (97.1 kg), SpO2 99 %.     12/21/2021    1:04 PM 11/01/2021    1:51 PM 07/27/2021    1:42 PM  Vitals with BMI  Height 6\' 0"  6\' 0"  6\' 0"   Weight 214 lbs 221 lbs 13 oz 212 lbs 10 oz  BMI 29.02 30.07 28.83  Systolic 111 108  Diastolic 68 62 72  Pulse 83 86 75    Orthostatic VS for the past 72 hrs (Last 3 readings):  Patient Position BP Location Cuff Size  12/21/21 1304 Sitting Left Arm Normal     Physical Exam Vitals reviewed.  Constitutional:      Appearance: He is well-developed.  Neck:     Vascular: No carotid bruit or JVD.  Cardiovascular:     Rate and Rhythm: Normal rate and regular rhythm.     Pulses: Normal pulses and intact distal pulses.          Carotid pulses are  on the right side with bruit and  on the left side with bruit.    Heart sounds: Normal heart sounds. No murmur heard.    No gallop.     Comments: Crisp mechanical AV click Pulmonary:     Effort: Pulmonary effort is normal. No accessory muscle usage or respiratory distress.     Breath sounds: Normal breath sounds.  Abdominal:     General: Bowel sounds are normal.     Palpations: Abdomen is soft.  Musculoskeletal:     Right lower leg: No edema.     Left lower leg: No edema.    Laboratory examination:      Latest Ref Rng & Units 10/29/2021   11:48 AM 07/03/2021    3:20 PM 07/03/2021    3:16 PM  CMP  Glucose 70 - 99 mg/dL 12/29/2021     BUN 6 - 24 mg/dL 10     Creatinine 07/05/2021 - 1.27 mg/dL 07/05/2021     Sodium 096 - 0.45 mmol/L 141  144  144   Potassium 3.5 - 5.2 mmol/L 4.7  3.8  3.8   Chloride 96 - 106 mmol/L 103     CO2 20 - 29 mmol/L 25     Calcium 8.7 - 10.2 mg/dL 8.4         Latest Ref Rng & Units 10/29/2021   11:48 AM 07/03/2021    3:20 PM 07/03/2021    3:16 PM  CBC  WBC 3.4 - 10.8 x10E3/uL 12.4     Hemoglobin 13.0 - 17.7 g/dL 8.9  12/29/2021  07/05/2021   Hematocrit 37.5 - 51.0 % 28.2  45.0  45.0   Platelets 150 - 450 x10E3/uL 512       HEMOGLOBIN A1C Lab Results  Component Value Date   HGBA1C 6.0 (H) 10/26/2012   MPG 126 (H) 10/26/2012  BNP (last 3 results) Recent Labs    02/20/21 1443 10/29/21 1148  BNP 54.0 304.0*   External labs:   Cholesterol, total 160.000 m 06/23/2020 HDL 38.000 mg 06/23/2020 LDL 94.000 mg 06/23/2020 Triglycerides 140.000 m 06/23/2020  TSH 1.520 06/23/2020  Medications and allergies   Allergies  Allergen Reactions   Prednisone Swelling and Other (See Comments)    Makes him crazy   Serzone [Nefazodone Hcl] Swelling and Other (See Comments)    Makes him crazy     Current Outpatient Medications:    ALPRAZolam (XANAX) 0.5 MG tablet, Take 1 tablet by mouth as needed., Disp: , Rfl:    aspirin 81 MG chewable tablet, Chew 1 tablet by mouth daily., Disp: , Rfl:    atorvastatin (LIPITOR) 40 MG tablet, Take 1 tablet by mouth daily., Disp: , Rfl:    carvedilol (COREG) 6.25 MG tablet, Take 6.25 mg by mouth daily., Disp: , Rfl:    fluticasone (FLONASE) 50 MCG/ACT nasal spray, Place 1 spray into the nose as needed., Disp: , Rfl:    furosemide (LASIX) 40 MG tablet, Take 40 mg by mouth 2 (two) times daily., Disp: , Rfl:    gabapentin (NEURONTIN) 100 MG capsule, Take 100 mg by mouth 3 (three) times daily., Disp: , Rfl:    ibuprofen (ADVIL) 200 MG tablet, Take 200 mg by mouth every 8 (eight) hours as needed for moderate pain., Disp: , Rfl:    methocarbamol (ROBAXIN) 500 MG tablet, Take 500 mg by mouth 3 (three) times daily as needed., Disp: , Rfl:    oxyCODONE (OXY IR/ROXICODONE) 5 MG immediate release tablet, Take 1 tablet by mouth 2 (two) times daily as needed for severe pain., Disp: , Rfl:    valsartan (DIOVAN) 80 MG tablet, Take 1 tablet (80 mg total) by mouth every evening., Disp: 30 tablet, Rfl: 2   warfarin (COUMADIN) 2 MG tablet, TAKE 1 TABLET BY MOUTH EVERY DAY, Disp: 30 tablet, Rfl: 0   warfarin (COUMADIN) 2.5 MG tablet, Take 1 tablet (2.5 mg total) by mouth daily., Disp: 90 tablet, Rfl: 3    zolpidem (AMBIEN) 10 MG tablet, Take 1 tablet (10 mg total) by mouth at bedtime as needed for sleep., Disp: 30 tablet, Rfl: 0    Radiology:   No results found.  Cardiac Studies:    AV replacement by minimally invasive surgery 10/28/2012: [facility] Ease pericardial tissue valve (size 23 mm, model # [id number], serial # [id number])  Right and left heart catheterization 07/03/2021:  Right dominant circulation, normal coronary arteries. Normal right heart catheterization with preserved cardiac output and cardiac index.  Left Heart Catheterization 07/03/21:  Angiographically normal coronary arteries Normal right sided filling pressures Unable to cross LV  Echocardiogram 07/12/2021 ([facility]):  MODERATE LV DYSFUNCTION (See above) WITH MILD LVH NORMAL RIGHT VENTRICULAR SYSTOLIC FUNCTION VALVULAR REGURGITATION: TRIVIAL AR, TRIVIAL MR, TRIVIAL PR, TRIVIAL TR PROSTHETIC VALVE(S): BIOPROSTHETIC AoV Mildly reduced left ventricular systolic function, estimated EF - 42% Normal right ventricular size and function. 59mm [employer]WARDS MAGNA EASE BIOPROSTHETIC AVR with mild central regurgitation, mean gradient of 23 mmHg, DI- 0.21 Compared to our office echocardiogram done on 05/17/2021, LVEF is estimated at 35 to 40% with mildly dilated LV, aortic valve peak gradient of 41 and mean gradient of 27 mmHg, aortic valve area 0.7 cm, dimensionless index 0.2.  Echocardiogram 12/03/2021:  Left ventricle cavity is normal in size. Mild concentric hypertrophy of  the left ventricle. Abnormal septal wall motion due to post-operative  valve. Mild global  hypokinesis. LVEF 40-45%. Indeterminate diastolic  function.  S/p 27 mm Saint Jude Regent valve, aortic root replacement with Bentall  procedure (Historical data). Well seated bioprosthetic valve with no  significant valvular stenosis or regurgitation.  Normal right atrial pressure.  Compared to previous study on 05/17/2021, aortic prosthetic valve stenosis  and aortic  regurgitation are resolved. LVEF has marginally improved from  35-40%.   AV Mean Grad 3.5 mmHg (<=40)  EKG EKG 11/01/2021: Normal sinus rhythm at rate of 82 bpm, left axis deviation, left anteriorfascicular block.  IVCD, borderline criteria for LVH.  Low-voltage complexes.  Nonspecific T abnormality.  No change from 03/01/2020  . Assessment     ICD-10-CM   1. H/O mechanical aortic valve replacement  Z95.2 POCT INR    2. Long term (current) use of anticoagulants  Z79.01     3. Encounter for therapeutic drug monitoring  Z51.81     4. HFrEF (heart failure with reduced ejection fraction) (HCC)  I50.20     5. Indication present for endocarditis prophylaxis  Z29.89       Orders Placed This Encounter  Procedures   POCT INR   No orders of the defined types were placed in this encounter.   Recommendations:   Bruce [name]  is a 51 y.o. male  with history of congenital bicuspid aortic valve undergone aortic valve replacement with a bioprosthetic valve by minimally invasive approach on 10/28/2012 with implantation of a [facility] Ease 23 mm pericardial valve.  Patient's symptoms of decreased exercise capacity started sometime in the fall 2022, echocardiogram at that time it revealed LVEF of 35% along with findings of moderate aortic valve stenosis. He underwent complex redo mechanical aortic valve replacement with 27 mm Saint Jude Regent valve along with aortic root replacement with [name] on 10/16/2021.  Long term (current) use of anticoagulants Encounter for therapeutic drug monitoring H/O mechanical aortic valve replacement INR 2.2 today, continue with current medication dose without changes. Will recheck INR in 4 weeks. Tolerating Warfarin without bleeding diathesis.  HFrEF (heart failure with reduced ejection fraction) (HCC) No clinical evidence of heart failure. He does not appear to be volume overloaded anymore.  Advised him to take furosemide only if he has weight  gain of >3 pounds in 2 days or significant leg edema or dyspnea. He was previously was on moderate dose Entresto for chronic systolic heart failure. Now on Valsartan 80mg  daily.  Entresto was no restarted due to soft blood pressure. Reviewed recent echocardiogram that did show slight improvement of LVEF to 40-45%. AV Mean Grad 3.5 mmHg (<=40).  Indication present for endocarditis prophylaxis Patient is aware of need for endocarditis prophylaxis.  Follow-up in 3 months or sooner if needed.   , AGNP-C 12/21/2021, 1:35 PM Office: [phone] Fax: [phone] Pager: [phone]

## 2021-12-23 NOTE — Addendum Note (Signed)
Addended by: Ernst Spell on: 12/23/2021 01:16 PM   Modules accepted: Level of Service

## 2021-12-25 NOTE — Progress Notes (Signed)
Error

## 2021-12-29 ENCOUNTER — Other Ambulatory Visit: Payer: Self-pay | Admitting: Cardiology

## 2021-12-29 DIAGNOSIS — I5022 Chronic systolic (congestive) heart failure: Secondary | ICD-10-CM

## 2022-01-16 ENCOUNTER — Other Ambulatory Visit: Payer: Self-pay | Admitting: Cardiology

## 2022-01-18 ENCOUNTER — Ambulatory Visit: Payer: BC Managed Care – PPO | Admitting: Cardiology

## 2022-01-18 DIAGNOSIS — Z952 Presence of prosthetic heart valve: Secondary | ICD-10-CM

## 2022-01-18 LAB — POCT INR: INR: 2.6 (ref 2.0–3.0)

## 2022-01-18 MED ORDER — WARFARIN SODIUM 3 MG PO TABS: 3.0000 mg | ORAL_TABLET | Freq: Every day | ORAL | 3 refills | Status: DC

## 2022-01-18 NOTE — Progress Notes (Signed)
Description   12/21/2021   INR today was 2.2. Patient goal level is (2-3) Prior dose was 6 mg MWF and 3 mg all the other days. patient will continue 6 mg MWF , and 3 mg all the other days.  Pt will recheck INR 4 weeks on 12/1.   Lab Results      Component                Value               Date                      INR                      2.2                 12/21/2021                INR                      2.4                 12/14/2021                INR                      1.6 (A)             12/03/2021             H/O mechanical aortic valve replacement  (primary encounter diagnosis) Plan: POCT INR

## 2022-01-25 ENCOUNTER — Encounter: Payer: Self-pay | Admitting: Cardiology

## 2022-01-25 DIAGNOSIS — Z952 Presence of prosthetic heart valve: Secondary | ICD-10-CM

## 2022-01-25 DIAGNOSIS — I5020 Unspecified systolic (congestive) heart failure: Secondary | ICD-10-CM

## 2022-01-29 NOTE — Progress Notes (Signed)
JG pt. DO not seen appt scheduled, just FYI.  Thanks MJP

## 2022-02-15 ENCOUNTER — Ambulatory Visit: Payer: BC Managed Care – PPO | Admitting: Cardiology

## 2022-02-15 DIAGNOSIS — Z7901 Long term (current) use of anticoagulants: Secondary | ICD-10-CM

## 2022-02-15 DIAGNOSIS — Z952 Presence of prosthetic heart valve: Secondary | ICD-10-CM

## 2022-02-15 LAB — POCT INR: INR: 2.5 (ref 2.0–3.0)

## 2022-02-16 NOTE — Progress Notes (Signed)
   Description   02/15/2022    INR today was 2.5. Patient goal level is (2.0-3.0)  Prior dose was 6 Monday, Wednesday, and Friday mg and 3 mg all the other days.  Patient will continue on  6 Monday, Wednesday, and Friday mg and 3 mg all the other days.  Pt will recheck INR 4 weeks on 02/02.   Lab Results      Component                Value               Date                      INR                      2.5                 02/15/2022                INR                      2.6                 01/18/2022                INR                      2.2                 12/21/2021             H/O mechanical aortic valve replacement  (primary encounter diagnosis) Plan: POCT INR      ICD-10-CM   1. H/O mechanical aortic valve replacement  Z95.2 POCT INR    2. Long term (current) use of anticoagulants  Z79.01      Orders Placed This Encounter  Procedures   POCT INR   Delilah Shan [facility]  Pager: [phone] Office: [phone]

## 2022-03-21 ENCOUNTER — Encounter: Payer: Self-pay | Admitting: Cardiology

## 2022-03-21 ENCOUNTER — Ambulatory Visit: Payer: BC Managed Care – PPO | Admitting: Cardiology

## 2022-03-21 VITALS — BP 130/73 | HR 73 | Resp 16 | Ht 72.0 in | Wt 223.3 lb

## 2022-03-21 DIAGNOSIS — Z952 Presence of prosthetic heart valve: Secondary | ICD-10-CM

## 2022-03-21 DIAGNOSIS — Z7901 Long term (current) use of anticoagulants: Secondary | ICD-10-CM

## 2022-03-21 DIAGNOSIS — I428 Other cardiomyopathies: Secondary | ICD-10-CM

## 2022-03-21 DIAGNOSIS — I491 Atrial premature depolarization: Secondary | ICD-10-CM

## 2022-03-21 LAB — POCT INR: POC INR: 2.6

## 2022-03-21 MED ORDER — WARFARIN SODIUM 6 MG PO TABS: 6.0000 mg | ORAL_TABLET | ORAL | 1 refills | Status: DC

## 2022-03-21 MED ORDER — WARFARIN SODIUM 3 MG PO TABS: 3.0000 mg | ORAL_TABLET | ORAL | 1 refills | Status: DC

## 2022-03-21 NOTE — Progress Notes (Signed)
Primary Physician/Referring:  Chilton Greathouse, MD  Patient ID: Bruce [name], male    DOB: [birth year], 52 y.o.   MRN: [id number]  Chief Complaint  Patient presents with   Congestive Heart Failure   AVR   HPI:    Bruce [name]  is a 52 y.o. male  with congenital bicuspid aortic valve undergone aortic valve replacement with a bioprosthetic valve by minimally invasive approach on 10/28/2012 with implantation of a [facility] Ease 23 mm pericardial valve.  Due to recurrence of aortic stenosis, cardiomyopathy, underwent Mechanical AVR with 27 mm Saint Jude Regent valve and aortic root replacement with [name] on 10/16/2021.   Is presently doing well, feels tired as he is overworking and plans to retire soon and take up another job.  Otherwise no other specific complaints, no PND or orthopnea or leg edema.  He is tolerating anticoagulation with Coumadin without any complications and his INR has been therapeutic.  Past Medical History:  Diagnosis Date   Anxiety    Xanax prn and takes at night to sleep   Aortic regurgitation    Aortic stenosis    Arthritis    back and neck   Cardiomyopathy (HCC)    Heart murmur    bicuspid valve   History of kidney stones    Hyperlipidemia    takes Atorvastatin daily   Joint swelling    Re-Do mechanical aortic valve replacement: [name] and (27 mm St. Jude Regent) AV replacemnt on 10/16/21 10/16/2021   S/P minimally invasive aortic valve replacement with bioprosthetic valve 10/28/2012   42mm [employer] Magna Ease bovine pericardial tissue valve placed via right anterior mini-thoracotomy approach   Seasonal allergies    Shortness of breath    when eating   Past Surgical History:  Procedure Laterality Date   AORTIC VALVE REPLACEMENT N/A 10/28/2012   Procedure: MINIMALLY INVASIVE AORTIC VALVE REPLACEMENT (AVR);  Surgeon: Purcell Nails, MD;  Location: [facility] OR;  Service: Open Heart Surgery;  Laterality: N/A;   cyst removed  from back of spine     INTRAOPERATIVE TRANSESOPHAGEAL ECHOCARDIOGRAM N/A 10/28/2012   Procedure: INTRAOPERATIVE TRANSESOPHAGEAL ECHOCARDIOGRAM;  Surgeon: Purcell Nails, MD;  Location: [facility] OR;  Service: Open Heart Surgery;  Laterality: N/A;   LEFT AND RIGHT HEART CATHETERIZATION WITH CORONARY ANGIOGRAM N/A 09/08/2012   Procedure: LEFT AND RIGHT HEART CATHETERIZATION WITH CORONARY ANGIOGRAM;  Surgeon: Pamella Pert, MD;  Location: [facility] CATH LAB;  Service: Cardiovascular;  Laterality: N/A;   LITHOTRIPSY     right hand surgery     RIGHT HEART CATH AND CORONARY ANGIOGRAPHY N/A 07/03/2021   Procedure: RIGHT HEART CATH AND CORONARY ANGIOGRAPHY;  Surgeon: Elder Negus, MD;  Location: [facility] INVASIVE CV LAB;  Service: Cardiovascular;  Laterality: N/A;   SHOULDER SURGERY  06/2020   TEE WITHOUT CARDIOVERSION N/A 09/01/2012   Procedure: TRANSESOPHAGEAL ECHOCARDIOGRAM (TEE);  Surgeon: Pamella Pert, MD;  Location: [facility] ENDOSCOPY;  Service: Cardiovascular;  Laterality: N/A;  h&p in file-[facility]   Family History  Problem Relation Age of Onset   Cancer Mother        brain, deceased age 48     Social History   Tobacco Use   Smoking status: Former    Packs/day: 0.50    Years: 20.00    Total pack years: 10.00    Types: Cigarettes    Quit date: 2016    Years since quitting: 8.0   Smokeless tobacco: Never  Substance Use Topics  Alcohol use: Not Currently    Alcohol/week: 1.0 - 2.0 standard drink of alcohol    Types: 1 - 2 Shots of liquor per week   Marital Status: Single  ROS  Review of Systems  Cardiovascular:  Negative for chest pain, dyspnea on exertion and leg swelling.   Objective  Blood pressure 130/73, pulse 73, resp. rate 16, height 6' (1.829 m), weight 223 lb 4.8 oz (101.3 kg), SpO2 98 %.     03/21/2022    2:44 PM 12/21/2021    1:04 PM 11/01/2021    1:51 PM  Vitals with BMI  Height 6\' 0"  6\' 0"  6\' 0"   Weight 223 lbs 5 oz 214 lbs 221 lbs 13 oz  BMI 30.28 32.95 18.84  Systolic  166 063 016  Diastolic 73 68 62  Pulse 73 83 86    Orthostatic VS for the past 72 hrs (Last 3 readings):  Patient Position BP Location Cuff Size  03/21/22 1444 Sitting Left Arm Large     Physical Exam Vitals reviewed.  Constitutional:      Appearance: He is well-developed.  Neck:     Vascular: No carotid bruit or JVD.  Cardiovascular:     Rate and Rhythm: Normal rate and regular rhythm. Extrasystoles are present.    Pulses: Normal pulses and intact distal pulses.          Carotid pulses are  on the right side with bruit and  on the left side with bruit.    Heart sounds: Normal heart sounds. No murmur heard.    No gallop.     Comments: Crisp mechanical AV click Pulmonary:     Effort: Pulmonary effort is normal. No accessory muscle usage or respiratory distress.     Breath sounds: Normal breath sounds.  Abdominal:     General: Bowel sounds are normal.     Palpations: Abdomen is soft.  Musculoskeletal:     Right lower leg: No edema.     Left lower leg: No edema.    Laboratory examination:      Latest Ref Rng & Units 10/29/2021   11:48 AM 07/03/2021    3:20 PM 07/03/2021    3:16 PM  CMP  Glucose 70 - 99 mg/dL 118     BUN 6 - 24 mg/dL 10     Creatinine 0.76 - 1.27 mg/dL 0.90     Sodium 134 - 144 mmol/L 141  144  144   Potassium 3.5 - 5.2 mmol/L 4.7  3.8  3.8   Chloride 96 - 106 mmol/L 103     CO2 20 - 29 mmol/L 25     Calcium 8.7 - 10.2 mg/dL 8.4         Latest Ref Rng & Units 10/29/2021   11:48 AM 07/03/2021    3:20 PM 07/03/2021    3:16 PM  CBC  WBC 3.4 - 10.8 x10E3/uL 12.4     Hemoglobin 13.0 - 17.7 g/dL 8.9  15.3  15.3   Hematocrit 37.5 - 51.0 % 28.2  45.0  45.0   Platelets 150 - 450 x10E3/uL 512      HEMOGLOBIN A1C Lab Results  Component Value Date   HGBA1C 6.0 (H) 10/26/2012   MPG 126 (H) 10/26/2012   BNP (last 3 results) Recent Labs    10/29/21 1148  BNP 304.0*   External labs:   Cholesterol, total 160.000 m 06/23/2020 HDL 38.000 mg 06/23/2020 LDL  94.000 mg 06/23/2020 Triglycerides 140.000 m 06/23/2020  TSH 1.520 06/23/2020  Medications and allergies   Allergies  Allergen Reactions   Prednisone Swelling and Other (See Comments)    Makes him crazy   Serzone [Nefazodone Hcl] Swelling and Other (See Comments)    Makes him crazy     Current Outpatient Medications:    ALPRAZolam (XANAX) 0.5 MG tablet, Take 1 tablet by mouth as needed., Disp: , Rfl:    aspirin 81 MG chewable tablet, Chew 1 tablet by mouth daily., Disp: , Rfl:    atorvastatin (LIPITOR) 80 MG tablet, TAKE 1 TABLET BY MOUTH EVERY DAY, Disp: 90 tablet, Rfl: 3   carvedilol (COREG) 6.25 MG tablet, Take 6.25 mg by mouth daily., Disp: , Rfl:    oxyCODONE (OXY IR/ROXICODONE) 5 MG immediate release tablet, Take 1 tablet by mouth 2 (two) times daily as needed for severe pain., Disp: , Rfl:    valsartan (DIOVAN) 80 MG tablet, TAKE 1 TABLET BY MOUTH EVERY EVENING, Disp: 90 tablet, Rfl: 0   warfarin (COUMADIN) 3 MG tablet, Take 1 tablet (3 mg total) by mouth daily. (Patient taking differently: Take 3 mg by mouth daily. 6mg  Mon, Wed, Fri), Disp: 90 tablet, Rfl: 3   warfarin (COUMADIN) 3 MG tablet, Take 1 tablet (3 mg total) by mouth as directed. One tab in the evening daily as directed, Disp: 90 tablet, Rfl: 1   warfarin (COUMADIN) 6 MG tablet, Take 1 tablet (6 mg total) by mouth as directed. One tab every evening as directed, Disp: 90 tablet, Rfl: 1   zolpidem (AMBIEN) 10 MG tablet, Take 1 tablet (10 mg total) by mouth at bedtime as needed for sleep., Disp: 30 tablet, Rfl: 0    Radiology:   No results found.  Cardiac Studies:    AV replacement by minimally invasive surgery 10/28/2012: [facility] Ease pericardial tissue valve (size 23 mm, model # 3300TFX, serial # [id number])  Right and left heart catheterization 07/03/2021:  Right dominant circulation, normal coronary arteries. Normal right heart catheterization with preserved cardiac output and cardiac index.  Left Heart  Catheterization 07/03/21:  Angiographically normal coronary arteries Normal right sided filling pressures Unable to cross LV  Echocardiogram 07/12/2021 ([facility]):  MODERATE LV DYSFUNCTION (See above) WITH MILD LVH NORMAL RIGHT VENTRICULAR SYSTOLIC FUNCTION VALVULAR REGURGITATION: TRIVIAL AR, TRIVIAL MR, TRIVIAL PR, TRIVIAL TR PROSTHETIC VALVE(S): BIOPROSTHETIC AoV Mildly reduced left ventricular systolic function, estimated EF - 42% Normal right ventricular size and function. 41mm EDWARDS MAGNA EASE BIOPROSTHETIC AVR with mild central regurgitation, mean gradient of 23 mmHg, DI- 0.21 Compared to our office echocardiogram done on 05/17/2021, LVEF is estimated at 35 to 40% with mildly dilated LV, aortic valve peak gradient of 41 and mean gradient of 27 mmHg, aortic valve area 0.7 cm, dimensionless index 0.2.  Echocardiogram 12/03/2021:  Left ventricle cavity is normal in size. Mild concentric hypertrophy of  the left ventricle. Abnormal septal wall motion due to post-operative  valve. Mild global hypokinesis. LVEF 40-45%. Indeterminate diastolic  function.  S/p 27 mm Saint Jude Regent valve, aortic root replacement with Bentall  procedure (Historical data). Well seated bioprosthetic valve with no  significant valvular stenosis or regurgitation.  Normal right atrial pressure.  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 LVEF has marginally improved from  35-40%.   AV Mean Grad 3.5 mmHg (<=40)  EKG  EKG 03/21/2022: Normal sinus rhythm at rate of 75 bpm, left axis deviation, left anterior fascicular block.  IVCD, borderline criteria  for LVH.  LVH with strain pattern in 1 and aVL.  Frequent PACs in trigeminal pattern and occasional PVCs.  Compared to 11/01/2021, PACs new. . Assessment     ICD-10-CM   1. Mechanical AVR with 27 mm Saint Jude Regent valve and aortic root replacement with [name] on 10/16/2021.  Z95.2  warfarin (COUMADIN) 3 MG tablet    warfarin (COUMADIN) 6 MG tablet    EKG 12-Lead    POCT INR    2. PAC (premature atrial contraction)  I49.1     3. Non-ischemic cardiomyopathy (HCC)  I42.8     4. Long term (current) use of anticoagulants  Z79.01 warfarin (COUMADIN) 3 MG tablet    warfarin (COUMADIN) 6 MG tablet      Orders Placed This Encounter  Procedures   POCT INR   EKG 12-Lead   Meds ordered this encounter  Medications   warfarin (COUMADIN) 3 MG tablet    Sig: Take 1 tablet (3 mg total) by mouth as directed. One tab in the evening daily as directed    Dispense:  90 tablet    Refill:  1   warfarin (COUMADIN) 6 MG tablet    Sig: Take 1 tablet (6 mg total) by mouth as directed. One tab every evening as directed    Dispense:  90 tablet    Refill:  1    Recommendations:   Bruce [name]  is a 52 y.o. male  with congenital bicuspid aortic valve undergone aortic valve replacement with a bioprosthetic valve by minimally invasive approach on 10/28/2012 with implantation of a [facility] Ease 23 mm pericardial valve.  Due to recurrence of aortic stenosis, cardiomyopathy, underwent Mechanical AVR with 27 mm Saint Jude Regent valve and aortic root replacement with [name] on 10/16/2021.   1. Mechanical AVR with 27 mm Saint Jude Regent valve and aortic root replacement with [name] on 10/16/2021. Patient's INR is therapeutic.  No changes in the anticoagulation was performed.  I reviewed his echocardiogram, LVEF is around 45%, aortic valve is functioning normally and pressure gradients are normal range. He will continue with Coumadin at present dose, he is aware that he needs endocarditis prophylaxis.  2. PAC (premature atrial contraction) On exam today he had an irregularly irregular pulse, I thought he may be in atrial fibrillation.  In fact he has frequent PACs and also occasional PVCs.  He has just had chips and also had a big soda drink, could have contributed  to this.  3. Non-ischemic cardiomyopathy (HCC) Mild decrease in LVEF without clinical evidence of heart failure.  Will repeat echocardiogram in 6 months, as he is presently doing well, expect LVEF to further improve or stabilize.  4. Long term (current) use of anticoagulants No bleeding diathesis on warfarin.  Continue the same.  Description   03/21/2022  INR today was 2.6. Patient goal level is (2.0-3.0)  Prior dose was 6 mg Monday, Wednesday, Friday and 3 mg all the other days.  patient will continue with this dosage plan.   Pt will recheck INR 4 weeks on 04/25/2022.  Lab Results      Component                Value               Date                      INR  2.6                 03/21/2022                INR                      2.5                 02/15/2022                INR                      2.6                 01/18/2022            H/O mechanical aortic valve replacement  (primary encounter diagnosis) Plan: warfarin (COUMADIN) 3 MG tablet, warfarin        (COUMADIN) 6 MG tablet, EKG 12-Lead, POCT INR  Non-ischemic cardiomyopathy ([place])  Long term (current) use of anticoagulants Plan: warfarin (COUMADIN) 3 MG tablet, warfarin        (COUMADIN) 6 MG tablet         Adrian Prows, MD, [facility] 03/21/2022, 4:36 PM Office: [phone] Fax: [phone] Pager: [phone]

## 2022-03-22 ENCOUNTER — Ambulatory Visit: Payer: BC Managed Care – PPO

## 2022-03-22 ENCOUNTER — Other Ambulatory Visit: Payer: Self-pay

## 2022-03-22 DIAGNOSIS — I5022 Chronic systolic (congestive) heart failure: Secondary | ICD-10-CM

## 2022-03-22 MED ORDER — ATORVASTATIN CALCIUM 80 MG PO TABS: 80.0000 mg | ORAL_TABLET | Freq: Every day | ORAL | 3 refills | Status: DC

## 2022-03-22 MED ORDER — CARVEDILOL 6.25 MG PO TABS: 6.2500 mg | ORAL_TABLET | Freq: Every day | ORAL | 3 refills | Status: DC

## 2022-03-22 MED ORDER — VALSARTAN 80 MG PO TABS: 80.0000 mg | ORAL_TABLET | Freq: Every evening | ORAL | 0 refills | Status: DC

## 2022-04-19 ENCOUNTER — Ambulatory Visit: Payer: BC Managed Care – PPO | Admitting: Cardiology

## 2022-04-19 DIAGNOSIS — Z952 Presence of prosthetic heart valve: Secondary | ICD-10-CM

## 2022-04-19 LAB — POCT INR: INR: 2.4 (ref 2.0–3.0)

## 2022-04-19 NOTE — Progress Notes (Signed)
   Description   04/19/2022  INR today was 2.4 Patient goal level is (2.0-3.0)  Prior dose was 6 mg Monday, Wednesday, Friday and 3 mg all the other days.  patient will continue with this dosage plan.   Pt will recheck INR 4 weeks on 05/16/2022.  Lab Results      Component                Value               Date                      INR                      2.4                 04/19/2022                INR                      2.6                 03/21/2022                INR                      2.5                 02/15/2022               H/O mechanical aortic valve replacement  (primary encounter diagnosis) Plan: warfarin (COUMADIN) 3 MG tablet, warfarin        (COUMADIN) 6 MG tablet, EKG 12-Lead, POCT INR  Non-ischemic cardiomyopathy ([place])  Long term (current) use of anticoagulants Plan: warfarin (COUMADIN) 3 MG tablet, warfarin        (COUMADIN) 6 MG tablet       ICD-10-CM   1. Mechanical AVR with 27 mm Saint Jude Regent valve and aortic root replacement with [name] on 10/16/2021.  Z95.2 POCT INR     Orders Placed This Encounter  Procedures   POCT INR   [facility]  Pager: [phone] Office: [phone]

## 2022-04-23 ENCOUNTER — Other Ambulatory Visit: Payer: Self-pay | Admitting: Cardiology

## 2022-04-23 DIAGNOSIS — I5022 Chronic systolic (congestive) heart failure: Secondary | ICD-10-CM

## 2022-05-20 ENCOUNTER — Ambulatory Visit: Payer: BC Managed Care – PPO | Admitting: Cardiology

## 2022-05-20 DIAGNOSIS — Z952 Presence of prosthetic heart valve: Secondary | ICD-10-CM

## 2022-05-20 LAB — POCT INR: POC INR: 2

## 2022-05-20 NOTE — Progress Notes (Signed)
Description   05/20/2022  INR today was 2.0. Patient goal level is (2.0-3.0)  Prior dose was 6 mg Monday, Wednesday, Friday, and 3 mg all the other days. Patient reported eating salad last two days. No change made today, but recheck INR 1 weeks on 05/31/2022.  Lab Results      Component                Value               Date                      INR                      2.0                 05/20/2022                INR                      2.4                 04/19/2022                INR                      2.6                 03/21/2022            H/O mechanical aortic valve replacement  (primary encounter diagnosis) Plan: POCT INR

## 2022-05-30 ENCOUNTER — Ambulatory Visit: Payer: BC Managed Care – PPO | Admitting: Cardiology

## 2022-05-30 DIAGNOSIS — Z952 Presence of prosthetic heart valve: Secondary | ICD-10-CM

## 2022-05-30 LAB — POCT INR: POC INR: 2.7

## 2022-05-30 NOTE — Progress Notes (Signed)
Description   05/30/2022  INR today was 2.7. Patient goal level is (2.0-3.0)  Prior dose was 6 mg Monday, Wednesday, Friday, and 3 mg all the other days.  There will be no changes to this dosage plan.  Pt will recheck INR 4 weeks on 06/27/2022.  Lab Results      Component                Value               Date                      INR                      2.7                 05/30/2022                INR                      2.0                 05/20/2022                INR                      2.4                 04/19/2022            H/O mechanical aortic valve replacement  (primary encounter diagnosis) Plan: POCT INR

## 2022-06-27 ENCOUNTER — Ambulatory Visit: Payer: BC Managed Care – PPO | Admitting: Cardiology

## 2022-06-27 ENCOUNTER — Other Ambulatory Visit: Payer: BC Managed Care – PPO

## 2022-06-27 DIAGNOSIS — Z952 Presence of prosthetic heart valve: Secondary | ICD-10-CM

## 2022-06-27 LAB — POCT INR: POC INR: 2.6

## 2022-06-27 NOTE — Progress Notes (Signed)
Description   06/27/2022  INR today was 2.6. Patient goal level is (2.0-3.0)  Prior dose was 6 mg Monday, Wednesday, Friday, and 3 mg all the other days.  patient will continue with this dosage plan.  Pt will recheck INR 4 weeks on 07/25/2022.  Lab Results      Component                Value               Date                      INR                      2.6                 06/27/2022                INR                      2.7                 05/30/2022                INR                      2.0                 05/20/2022  H/O mechanical aortic valve replacement  (primary encounter diagnosis) Plan: POCT INR

## 2022-07-18 ENCOUNTER — Other Ambulatory Visit: Payer: Self-pay | Admitting: Cardiology

## 2022-07-18 DIAGNOSIS — I5022 Chronic systolic (congestive) heart failure: Secondary | ICD-10-CM

## 2022-07-25 ENCOUNTER — Ambulatory Visit: Payer: BC Managed Care – PPO | Admitting: Cardiology

## 2022-07-25 DIAGNOSIS — Z952 Presence of prosthetic heart valve: Secondary | ICD-10-CM

## 2022-07-25 LAB — POCT INR: INR: 3.9 — AB (ref 2.0–3.0)

## 2022-07-25 NOTE — Progress Notes (Signed)
Description   07/25/2022    INR today was 3.9. Patient goal level is (2.0-3.0)  Prior dose was 6 mg Monday, Wednesday, Friday, and 3 mg all the other days.  Patient will continue with 6 mg Monday, Wednesday, Friday, and 3 mg all the other days.  Pt will recheck INR 4 weeks on 07/03.   Lab Results      Component                Value               Date                      INR                      3.9 (A)             07/25/2022                INR                      2.6                 06/27/2022                INR                      2.7                 05/30/2022              Mechanical AVR 27 mm Saint Jude Regent valve and aortic root replacemen 10/16/2021.  (primary encounter diagnosis)

## 2022-08-08 ENCOUNTER — Ambulatory Visit: Payer: BC Managed Care – PPO | Admitting: Cardiology

## 2022-08-21 ENCOUNTER — Encounter: Payer: Self-pay | Admitting: Cardiology

## 2022-08-21 ENCOUNTER — Ambulatory Visit: Payer: BC Managed Care – PPO | Admitting: Cardiology

## 2022-08-21 VITALS — BP 100/66 | HR 66 | Ht 72.0 in | Wt 217.0 lb

## 2022-08-21 DIAGNOSIS — I5022 Chronic systolic (congestive) heart failure: Secondary | ICD-10-CM

## 2022-08-21 DIAGNOSIS — Z952 Presence of prosthetic heart valve: Secondary | ICD-10-CM

## 2022-08-21 DIAGNOSIS — I428 Other cardiomyopathies: Secondary | ICD-10-CM

## 2022-08-21 LAB — POCT INR: INR: 2.6 (ref 2.0–3.0)

## 2022-08-21 MED ORDER — VALSARTAN 160 MG PO TABS: 80.0000 mg | ORAL_TABLET | Freq: Two times a day (BID) | ORAL | 3 refills | Status: DC

## 2022-08-21 MED ORDER — CARVEDILOL 6.25 MG PO TABS
6.2500 mg | ORAL_TABLET | Freq: Two times a day (BID) | ORAL | 3 refills | Status: DC
Start: 2022-08-21 — End: 2022-09-16

## 2022-08-21 NOTE — Progress Notes (Unsigned)
Primary Physician/Referring:  Chilton Greathouse, MD  Patient ID: Bruce [name], male    DOB: March 08, 1970, 52 y.o.   MRN: [id number]  Chief Complaint  Patient presents with   H/O mechanical aortic valve replacement   Follow-up   Chronic systolic heart failure   HPI:    Bruce [name]  is a 52 y.o. male  with congenital bicuspid aortic valve undergone aortic valve replacement with a bioprosthetic valve by minimally invasive approach on 10/28/2012 with implantation of a [facility] Ease 23 mm pericardial valve.  Due to recurrence of aortic stenosis, cardiomyopathy, underwent Mechanical AVR with 27 mm Saint Jude Regent valve and aortic root replacement with [name] on 10/16/2021.   Is presently doing well, feels tired as he is overworking and plans to retire soon and take up another job.  Otherwise no other specific complaints, no PND or orthopnea or leg edema.  He is tolerating anticoagulation with Coumadin without any complications and his INR has been therapeutic.  Past Medical History:  Diagnosis Date   Anxiety    Xanax prn and takes at night to sleep   Aortic regurgitation    Aortic stenosis    Arthritis    back and neck   Cardiomyopathy (HCC)    Heart murmur    bicuspid valve   History of kidney stones    Hyperlipidemia    takes Atorvastatin daily   Joint swelling    Mechanical AVR with 27 mm Saint Jude Regent valve and aortic root replacement with [name] on 10/16/2021. 10/28/2012   23mm [employer] Ease bovine pericardial tissue valve placed via right anterior mini-thoracotomy approach   Re-Do mechanical aortic valve replacement: [name] and (27 mm St. Jude Regent) AV replacemnt on 10/16/21 10/16/2021   S/P minimally invasive aortic valve replacement with bioprosthetic valve 10/28/2012   23mm [employer] Magna Ease bovine pericardial tissue valve placed via right anterior mini-thoracotomy approach   Seasonal allergies    Shortness of  breath    when eating   Past Surgical History:  Procedure Laterality Date   AORTIC VALVE REPLACEMENT N/A 10/28/2012   Procedure: MINIMALLY INVASIVE AORTIC VALVE REPLACEMENT (AVR);  Surgeon: Purcell Nails, MD;  Location: [facility] OR;  Service: Open Heart Surgery;  Laterality: N/A;   cyst removed from back of spine     INTRAOPERATIVE TRANSESOPHAGEAL ECHOCARDIOGRAM N/A 10/28/2012   Procedure: INTRAOPERATIVE TRANSESOPHAGEAL ECHOCARDIOGRAM;  Surgeon: Purcell Nails, MD;  Location: [facility] OR;  Service: Open Heart Surgery;  Laterality: N/A;   LEFT AND RIGHT HEART CATHETERIZATION WITH CORONARY ANGIOGRAM N/A 09/08/2012   Procedure: LEFT AND RIGHT HEART CATHETERIZATION WITH CORONARY ANGIOGRAM;  Surgeon: Pamella Pert, MD;  Location: [facility] CATH LAB;  Service: Cardiovascular;  Laterality: N/A;   LITHOTRIPSY     right hand surgery     RIGHT HEART CATH AND CORONARY ANGIOGRAPHY N/A 07/03/2021   Procedure: RIGHT HEART CATH AND CORONARY ANGIOGRAPHY;  Surgeon: Elder Negus, MD;  Location: [facility] INVASIVE CV LAB;  Service: Cardiovascular;  Laterality: N/A;   SHOULDER SURGERY  06/2020   TEE WITHOUT CARDIOVERSION N/A 09/01/2012   Procedure: TRANSESOPHAGEAL ECHOCARDIOGRAM (TEE);  Surgeon: Pamella Pert, MD;  Location: [facility] ENDOSCOPY;  Service: Cardiovascular;  Laterality: N/A;  h&p in file-Hope   Family History  Problem Relation Age of Onset   Cancer Mother        brain, deceased age 37     Social History   Tobacco Use   Smoking status:  Former    Packs/day: 0.50    Years: 20.00    Additional pack years: 0.00    Total pack years: 10.00    Types: Cigarettes    Quit date: 2016    Years since quitting: 8.5   Smokeless tobacco: Never  Substance Use Topics   Alcohol use: Not Currently    Alcohol/week: 1.0 - 2.0 standard drink of alcohol    Types: 1 - 2 Shots of liquor per week   Marital Status: Single  ROS  Review of Systems  Cardiovascular:  Negative for chest pain, dyspnea on exertion and leg  swelling.   Objective  Blood pressure 100/66, pulse 66, height 6' (1.829 m), weight 217 lb (98.4 kg), SpO2 98 %.     08/21/2022   12:49 PM 03/21/2022    2:44 PM 12/21/2021    1:04 PM  Vitals with BMI  Height 6\' 0"  6\' 0"  6\' 0"   Weight 217 lbs 223 lbs 5 oz 214 lbs  BMI 29.42 30.28 29.02  Systolic 100 130 829  Diastolic 66 73 68  Pulse 66 73 83    Orthostatic VS for the past 72 hrs (Last 3 readings):  Patient Position BP Location Cuff Size  08/21/22 1249 Sitting Left Arm Normal     Physical Exam Vitals reviewed.  Neck:     Vascular: No carotid bruit (soft bruit (conducted0 bilateral.) or JVD.  Cardiovascular:     Rate and Rhythm: Normal rate and regular rhythm.     Pulses: Normal pulses and intact distal pulses.     Heart sounds: Murmur heard.     Early systolic murmur is present with a grade of 2/6 at the upper right sternal border.     No gallop.     Comments: Crisp mechanical AV click Pulmonary:     Effort: Pulmonary effort is normal. No accessory muscle usage or respiratory distress.     Breath sounds: Normal breath sounds.  Abdominal:     General: Bowel sounds are normal.     Palpations: Abdomen is soft.  Musculoskeletal:     Right lower leg: No edema.     Left lower leg: No edema.    Laboratory examination:   External labs:   Labs 10/22/2021:  Sodium 139, potassium 4.5, BUN 25, creatinine 1.1, EGFR 81 mL.  Hb 9.6, HCT 29.5, platelets 209.  Cholesterol, total 149.000 m 06/28/2021 HDL 38.000 mg 06/28/2021 LDL 86.000 mg 06/28/2021 Triglycerides 125.000 m 06/28/2021  TSH 1.520 06/23/2020  Medications and allergies   Allergies  Allergen Reactions   Prednisone Swelling and Other (See Comments)    Makes him crazy   Serzone [Nefazodone Hcl] Swelling and Other (See Comments)    Makes him crazy     Current Outpatient Medications:    ALPRAZolam (XANAX) 0.5 MG tablet, Take 1 tablet by mouth as needed., Disp: , Rfl:    aspirin 81 MG chewable tablet, Chew 1 tablet by  mouth daily., Disp: , Rfl:    atorvastatin (LIPITOR) 80 MG tablet, Take 1 tablet (80 mg total) by mouth daily., Disp: 90 tablet, Rfl: 3   oxyCODONE (OXY IR/ROXICODONE) 5 MG immediate release tablet, Take 1 tablet by mouth 2 (two) times daily as needed for severe pain., Disp: , Rfl:    warfarin (COUMADIN) 3 MG tablet, Take 1 tablet (3 mg total) by mouth daily. (Patient taking differently: Take 3 mg by mouth daily. 6mg  Mon, Wed, Fri), Disp: 90 tablet, Rfl: 3   warfarin (COUMADIN) 3  MG tablet, Take 1 tablet (3 mg total) by mouth as directed. One tab in the evening daily as directed, Disp: 90 tablet, Rfl: 1   warfarin (COUMADIN) 6 MG tablet, Take 1 tablet (6 mg total) by mouth as directed. One tab every evening as directed, Disp: 90 tablet, Rfl: 1   carvedilol (COREG) 6.25 MG tablet, Take 1 tablet (6.25 mg total) by mouth 2 (two) times daily with a meal., Disp: 180 tablet, Rfl: 3   valsartan (DIOVAN) 160 MG tablet, Take 0.5 tablets (80 mg total) by mouth 2 (two) times daily., Disp: 90 tablet, Rfl: 3    Radiology:   CT angiogram chest 06/06/2022: Expected postoperative appearance of the aorta status post aortic valve  replacement and ascending aorta repair with coronary reimplantation. A small exophytic lesion arising off the anterior superior pole of the  right kidney is unchanged from the June 2023 reference CT. It is too small to characterize further.    Cardiac Studies:    AV replacement by minimally invasive surgery 10/28/2012: [facility] Ease pericardial tissue valve (size 23 mm, model # 3300TFX, serial # [id number])  Right and left heart catheterization 07/03/2021:  Right dominant circulation, normal coronary arteries. Normal right heart catheterization with preserved cardiac output and cardiac index.  Left Heart Catheterization 07/03/21:  Angiographically normal coronary arteries Normal right sided filling pressures Unable to cross LV  Echocardiogram 06/06/2022: Moderate LV systolic  dysfunction, LVEF 40% with global hypokinesis.  LV is mildly enlarged.  Global longitudinal strain -12.8%. Normal RV systolic function and size. Prosthetic 27 mm Saint Jude Regent mechanical aortic valve SP [name] 10/16/2021.  Trivial paravalvular aortic regurgitation. No significant change from 12/03/2021.  EKG  EKG 08/21/2022: Normal sinus rhythm at rate of 62 bpm, LAE, left anterior fascicular block.  IVCD, borderline criteria for LVH.  Nonspecific ST abnormality.  Compared to 03/21/2022, occasional PACs and PVCs not present  . Assessment     ICD-10-CM   1. Chronic systolic heart failure (HCC)  Z61.09 EKG 12-Lead    carvedilol (COREG) 6.25 MG tablet    valsartan (DIOVAN) 160 MG tablet    2. Non-ischemic cardiomyopathy (HCC)  I42.8     3. Mechanical AVR 27 mm Saint Jude Regent valve and aortic root replacemen 10/16/2021.  Z95.2 EKG 12-Lead      Orders Placed This Encounter  Procedures   EKG 12-Lead   Meds ordered this encounter  Medications   carvedilol (COREG) 6.25 MG tablet    Sig: Take 1 tablet (6.25 mg total) by mouth 2 (two) times daily with a meal.    Dispense:  180 tablet    Refill:  3   valsartan (DIOVAN) 160 MG tablet    Sig: Take 0.5 tablets (80 mg total) by mouth 2 (two) times daily.    Dispense:  90 tablet    Refill:  3    Recommendations:   Bruce [name]  is a 52 y.o. male  with congenital bicuspid aortic valve undergone aortic valve replacement with a bioprosthetic valve by minimally invasive approach on 10/28/2012 with implantation of a [facility] Ease 23 mm pericardial valve.  Due to recurrence of aortic stenosis, cardiomyopathy, underwent Mechanical AVR with 27 mm Saint Jude Regent valve and aortic root replacement with [name] on 10/16/2021.   1. Chronic systolic heart failure (HCC) Patient without clinical evidence of heart failure.  He could not tolerate Entresto.  Presently on moderate dose of Coreg at 6.25 twice daily and valsartan  160 mg 1/2 tablet twice daily, continue the same.  Labs reviewed. - EKG 12-Lead - carvedilol (COREG) 6.25 MG tablet; Take 1 tablet (6.25 mg total) by mouth 2 (two) times daily with a meal.  Dispense: 180 tablet; Refill: 3 - valsartan (DIOVAN) 160 MG tablet; Take 0.5 tablets (80 mg total) by mouth 2 (two) times daily.  Dispense: 90 tablet; Refill: 3  2. Non-ischemic cardiomyopathy (HCC) I reviewed his echocardiogram, it was performed at [facility], no significant change in his LVEF, aortic valve is functioning normally.  3. Mechanical AVR 27 mm Saint Jude Regent valve and aortic root replacemen 10/16/2021. Patient is presently on warfarin, INR was therapeutic at 2.6 today, will continue to monitor his INR, in 6 weeks.  I will see him in the office on annual basis unless he has any other issues.  Lab Results  Component Value Date   INR 2.6 08/21/2022   INR 3.9 (A) 07/25/2022   INR 2.6 06/27/2022    - EKG 12-Lead   Bruce Decamp, MD, [facility] 08/21/2022, 1:14 PM Office: [phone] Fax: [phone] Pager: [phone]

## 2022-08-27 ENCOUNTER — Encounter: Payer: Self-pay | Admitting: *Deleted

## 2022-08-28 ENCOUNTER — Encounter: Payer: Self-pay | Admitting: Neurology

## 2022-08-28 ENCOUNTER — Ambulatory Visit: Payer: BC Managed Care – PPO | Admitting: Neurology

## 2022-08-28 VITALS — BP 103/67 | HR 71 | Ht 72.0 in | Wt 215.0 lb

## 2022-08-28 DIAGNOSIS — E663 Overweight: Secondary | ICD-10-CM

## 2022-08-28 DIAGNOSIS — I428 Other cardiomyopathies: Secondary | ICD-10-CM

## 2022-08-28 DIAGNOSIS — I5022 Chronic systolic (congestive) heart failure: Secondary | ICD-10-CM | POA: Diagnosis not present

## 2022-08-28 DIAGNOSIS — R351 Nocturia: Secondary | ICD-10-CM

## 2022-08-28 DIAGNOSIS — Z952 Presence of prosthetic heart valve: Secondary | ICD-10-CM

## 2022-08-28 DIAGNOSIS — R0681 Apnea, not elsewhere classified: Secondary | ICD-10-CM

## 2022-08-28 DIAGNOSIS — R0683 Snoring: Secondary | ICD-10-CM

## 2022-08-28 NOTE — Progress Notes (Addendum)
Subjective:    Patient ID: Bruce [name] is a 52 y.o. male.  HPI    Huston Foley, MD, PhD [facility] [address] [address] [place], [place] [address]  Dear Dr. Felipa Eth,  I saw your patient, Bruce [name], upon your kind request in my sleep clinic today for initial consultation of his sleep disorder, in particular, concern for underlying obstructive sleep apnea.  The patient is unaccompanied today.  As you know, Mr. [name] is a 52 year old male with an underlying medical history of aortic stenosis with status post AVR in 2014 and redo in 2023, CHF, cardiomyopathy, history of kidney stones, hyperlipidemia, headaches, erythrocytosis, anxiety and overweight state, who reports snoring and excessive daytime somnolence.  His Epworth sleepiness score is 2 out of 24, fatigue severity score is 43 out of 63.  He does not wake up rested, he has some trouble going to sleep and staying asleep.  Since his aortic valve surgery last year he has had discomfort in the chest wall, he has had pain and usually ends up taking oxycodone, 1/day.  He has had sternal nonunion and was offered the possibility of rewiring but has been reluctant to pursue another surgical procedure.  He is on Coumadin.  He takes Xanax at bedtime, 0.5 mg strength.  I reviewed your office note from 07/11/2022.  He does snore and snoring has become worse per partner.  He has had some apneas which were witnessed.  He denies recurrent morning headaches, he does have nocturia about once or twice per average night.  Bedtime is generally between 10 and 11 PM and rise time between 5 and 6.  He is currently in between jobs, he will start a new job in August 2024.  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  He has no TV in his bedroom, he does not share his bedroom currently, he they have 2 dogs and 1 cat in the household and the pets do not sleep with him.  He does not drink any alcohol, he quit smoking in 2014.  He drinks caffeine  in the form of soda, 2 to 3 cans/day.  He has no children and lives with his partner.  His Past Medical History Is Significant For: Past Medical History:  Diagnosis Date   Anxiety    Xanax prn and takes at night to sleep   Aortic regurgitation    Aortic stenosis    Arthritis    back and neck   Cardiomyopathy (HCC)    Headache    with vision loss in one eye- MRI brain 5/08- tiny acute nonhemorrhagic infarct?, minimal nonspecific white matter type changes, minimal branch vessel irregularity, also DG eye- negative foreign body   Heart murmur    bicuspid valve   History of kidney stones    Hyperlipidemia    takes Atorvastatin daily   Joint swelling    Mechanical AVR with 27 mm Saint Jude Regent valve and aortic root replacement with [name] on 10/16/2021. 10/28/2012   23mm Edwards Magna Ease bovine pericardial tissue valve placed via right anterior mini-thoracotomy approach   Re-Do mechanical aortic valve replacement: [name] and (27 mm St. Jude Regent) AV replacemnt on 10/16/21 10/16/2021   S/P minimally invasive aortic valve replacement with bioprosthetic valve 10/28/2012   23mm Edwards Magna Ease bovine pericardial tissue valve placed via right anterior mini-thoracotomy approach   Seasonal allergies    Shortness of breath    when eating    His Past  Surgical History Is Significant For: Past Surgical History:  Procedure Laterality Date   AORTIC VALVE REPLACEMENT N/A 10/28/2012   Procedure: MINIMALLY INVASIVE AORTIC VALVE REPLACEMENT (AVR);  Surgeon: Purcell Nails, MD;  Location: [facility] OR;  Service: Open Heart Surgery;  Laterality: N/A;   AORTIC VALVE REPLACEMENT  10/15/2021   BACK SURGERY     cyst removed from back of spine     INTRAOPERATIVE TRANSESOPHAGEAL ECHOCARDIOGRAM N/A 10/28/2012   Procedure: INTRAOPERATIVE TRANSESOPHAGEAL ECHOCARDIOGRAM;  Surgeon: Purcell Nails, MD;  Location: [facility] OR;  Service: Open Heart Surgery;  Laterality: N/A;   LEFT AND RIGHT  HEART CATHETERIZATION WITH CORONARY ANGIOGRAM N/A 09/08/2012   Procedure: LEFT AND RIGHT HEART CATHETERIZATION WITH CORONARY ANGIOGRAM;  Surgeon: Pamella Pert, MD;  Location: [facility] CATH LAB;  Service: Cardiovascular;  Laterality: N/A;   LITHOTRIPSY     right hand surgery     RIGHT HEART CATH AND CORONARY ANGIOGRAPHY N/A 07/03/2021   Procedure: RIGHT HEART CATH AND CORONARY ANGIOGRAPHY;  Surgeon: Elder Negus, MD;  Location: [facility] INVASIVE CV LAB;  Service: Cardiovascular;  Laterality: N/A;   SHOULDER SURGERY  06/2020   TEE WITHOUT CARDIOVERSION N/A 09/01/2012   Procedure: TRANSESOPHAGEAL ECHOCARDIOGRAM (TEE);  Surgeon: Pamella Pert, MD;  Location: [facility] ENDOSCOPY;  Service: Cardiovascular;  Laterality: N/A;  h&p in file-Hope    His Family History Is Significant For: Family History  Problem Relation Age of Onset   Cancer Mother        brain, deceased age 68    Brain cancer Mother    COPD Mother    Gout Brother     His Social History Is Significant For: Social History   Socioeconomic History   Marital status: Single    Spouse name: Not on file   Number of children: 0   Years of education: Not on file   Highest education level: Not on file  Occupational History   Not on file  Tobacco Use   Smoking status: Former    Packs/day: 0.50    Years: 20.00    Additional pack years: 0.00    Total pack years: 10.00    Types: Cigarettes    Quit date: 2016    Years since quitting: 8.5   Smokeless tobacco: Never  Vaping Use   Vaping Use: Never used  Substance and Sexual Activity   Alcohol use: Not Currently    Alcohol/week: 1.0 - 2.0 standard drink of alcohol    Types: 1 - 2 Shots of liquor per week   Drug use: No   Sexual activity: Not on file  Other Topics Concern   Not on file  Social History Narrative   Not on file   Social Determinants of Health   Financial Resource Strain: Not on file  Food Insecurity: Not on file  Transportation Needs: Not on file  Physical  Activity: Not on file  Stress: Not on file  Social Connections: Not on file    His Allergies Are:  Allergies  Allergen Reactions   Prednisone Swelling and Other (See Comments)    Makes him crazy   Serzone [Nefazodone Hcl] Swelling and Other (See Comments)    Makes him crazy  :   His Current Medications Are:  Outpatient Encounter Medications as of 08/28/2022  Medication Sig   ALPRAZolam (XANAX) 0.5 MG tablet Take 1 tablet by mouth as needed.   aspirin 81 MG chewable tablet Chew 1 tablet by mouth daily.   atorvastatin (LIPITOR) 80 MG  tablet Take 1 tablet (80 mg total) by mouth daily.   carvedilol (COREG) 6.25 MG tablet Take 1 tablet (6.25 mg total) by mouth 2 (two) times daily with a meal.   oxyCODONE (OXY IR/ROXICODONE) 5 MG immediate release tablet Take 1 tablet by mouth 2 (two) times daily as needed for severe pain.   valsartan (DIOVAN) 160 MG tablet Take 0.5 tablets (80 mg total) by mouth 2 (two) times daily.   warfarin (COUMADIN) 3 MG tablet Take 1 tablet (3 mg total) by mouth as directed. One tab in the evening daily as directed (Patient taking differently: Take 3 mg by mouth as directed. One tab in the evening daily as directed MON, WED, FRI)   warfarin (COUMADIN) 6 MG tablet Take 1 tablet (6 mg total) by mouth as directed. One tab every evening as directed   [DISCONTINUED] warfarin (COUMADIN) 3 MG tablet Take 1 tablet (3 mg total) by mouth daily. (Patient not taking: Reported on 08/28/2022)   No facility-administered encounter medications on file as of 08/28/2022.  :   Review of Systems:  Out of a complete 14 point review of systems, all are reviewed and negative with the exception of these symptoms as listed below:  Review of Systems  Neurological:        Rm 5 alone Pt is well, reports in Aug he had his second open heart surgery and he noticed a increase in apnea events after. He gets about 6 hrs of broken sleep a night and does have trouble with getting up and active.      Objective:  Neurological Exam  Physical Exam Physical Examination:   Vitals:   08/28/22 1109  BP: 103/67  Pulse: 71    General Examination: The patient is a very pleasant 52 y.o. male in no acute distress. He appears well-developed and well-nourished and well groomed.   HEENT: Normocephalic, atraumatic, pupils are equal, round and reactive to light, extraocular tracking is good without limitation to gaze excursion or nystagmus noted. Hearing is grossly intact. Face is symmetric with normal facial animation. Speech is clear with no dysarthria noted. There is no hypophonia. There is no lip, neck/head, jaw or voice tremor. Neck is supple with full range of passive and active motion. There are no carotid bruits on auscultation. Oropharynx exam reveals: mild mouth dryness, good dental hygiene and marked airway crowding, due to larger uvula and tongue size of 1-2+.  Thicker tongue noted.  Mallampati class II.  Neck circumference 19 inches.  Mild overbite noted.  Tongue protrudes centrally and palate elevates symmetrically.   Chest: Clear to auscultation without wheezing, rhonchi or crackles noted.  Heart: S1+S2+0, regular and normal without murmurs, loud valve click noted.   Abdomen: Soft, non-tender and non-distended.  Extremities: There is no pitting edema in the distal lower extremities bilaterally.   Skin: Warm and dry without trophic changes noted.   Musculoskeletal: exam reveals no obvious joint deformities.   Neurologically:  Mental status: The patient is awake, alert and oriented in all 4 spheres. His immediate and remote memory, attention, language skills and fund of knowledge are appropriate. There is no evidence of aphasia, agnosia, apraxia or anomia. Speech is clear with normal prosody and enunciation. Thought process is linear. Mood is normal and affect is normal.  Cranial nerves II - XII are as described above under HEENT exam.  Motor exam: Normal bulk, strength and tone  is noted. There is no obvious action or resting tremor.  Fine motor skills and  coordination: grossly intact.  Cerebellar testing: No dysmetria or intention tremor. There is no truncal or gait ataxia.  Sensory exam: intact to light touch in the upper and lower extremities.  Gait, station and balance: He stands easily. No veering to one side is noted. No leaning to one side is noted. Posture is age-appropriate and stance is narrow based. Gait shows normal stride length and normal pace. No problems turning are noted.   Assessment and Plan:  In summary, DEONTREZ SLAVENS is a very pleasant 52 y.o.-year old male with an underlying medical history of aortic stenosis with status post AVR in 2014 and redo in 2023, CHF, cardiomyopathy, history of kidney stones, hyperlipidemia, headaches, erythrocytosis, anxiety and overweight state, whose history and physical exam are concerning for sleep disordered breathing, particularly obstructive sleep apnea (OSA). A laboratory attended sleep study is typically considered "gold standard" for evaluation of sleep disordered breathing.   I had a long chat with the patient about my findings and the diagnosis of sleep apnea, particularly OSA, its prognosis and treatment options. We talked about medical/conservative treatments, surgical interventions and non-pharmacological approaches for symptom control. I explained, in particular, the risks and ramifications of untreated moderate to severe OSA, especially with respect to developing cardiovascular disease down the road, including congestive heart failure (CHF), difficult to treat hypertension, cardiac arrhythmias (particularly A-fib), neurovascular complications including TIA, stroke and dementia. Even type 2 diabetes has, in part, been linked to untreated OSA. Symptoms of untreated OSA may include (but may not be limited to) daytime sleepiness, nocturia (i.e. frequent nighttime urination), memory problems, mood irritability and  suboptimally controlled or worsening mood disorder such as depression and/or anxiety, lack of energy, lack of motivation, physical discomfort, as well as recurrent headaches, especially morning or nocturnal headaches. We talked about the importance of maintaining a healthy lifestyle and striving for healthy weight. In addition, we talked about the importance of striving for and maintaining good sleep hygiene]. I recommended a sleep study at this time. I outlined the differences between a laboratory attended sleep study which is considered more comprehensive and accurate over the option of a home sleep test (HST); the latter may lead to underestimation of sleep disordered breathing in some instances and does not help with diagnosing upper airway resistance syndrome and is not accurate enough to diagnose primary central sleep apnea typically. I outlined possible surgical and non-surgical treatment options of OSA, including the use of a positive airway pressure (PAP) device (i.e. CPAP, AutoPAP/APAP or BiPAP in certain circumstances), a custom-made dental device (aka oral appliance, which would require a referral to a specialist dentist or orthodontist typically, and is generally speaking not considered for patients with full dentures or edentulous state), upper airway surgical options, such as traditional UPPP (which is not considered a first-line treatment) or the Inspire device (hypoglossal nerve stimulator, which would involve a referral for consultation with an ENT surgeon, after careful selection, following inclusion criteria - also not first-line treatment). I explained the PAP treatment option to the patient in detail, as this is generally considered first-line treatment.  The patient indicated that he would be willing to try PAP therapy, if the need arises. I explained the importance of being compliant with PAP treatment, not only for insurance purposes but primarily to improve patient's symptoms symptoms,  and for the patient's long term health benefit, including to reduce His cardiovascular risks longer-term.    We will pick up our discussion about the next steps and treatment options after testing.  We will keep him posted as to the test results by phone call and/or MyChart messaging where possible.  We will plan to follow-up in sleep clinic accordingly as well.  I answered all his questions today and the patient was in agreement.   I encouraged him to call with any interim questions, concerns, problems or updates or email [place] through MyChart.  Generally speaking, sleep test authorizations may take up to 2 weeks, sometimes less, sometimes longer, the patient is encouraged to get in touch with [place] if they do not hear back from the sleep lab staff directly within the next 2 weeks.  Thank you very much for allowing me to participate in the care of this nice patient. If I can be of any further assistance to you please do not hesitate to call me at [phone].  Sincerely,   Huston Foley, MD, PhD

## 2022-08-28 NOTE — Patient Instructions (Signed)

## 2022-09-14 ENCOUNTER — Other Ambulatory Visit: Payer: Self-pay | Admitting: Cardiology

## 2022-09-14 DIAGNOSIS — Z7901 Long term (current) use of anticoagulants: Secondary | ICD-10-CM

## 2022-09-14 DIAGNOSIS — Z952 Presence of prosthetic heart valve: Secondary | ICD-10-CM

## 2022-09-15 ENCOUNTER — Other Ambulatory Visit: Payer: Self-pay | Admitting: Cardiology

## 2022-09-15 DIAGNOSIS — I5022 Chronic systolic (congestive) heart failure: Secondary | ICD-10-CM

## 2022-09-18 ENCOUNTER — Telehealth: Payer: Self-pay | Admitting: Neurology

## 2022-09-18 DIAGNOSIS — R0683 Snoring: Secondary | ICD-10-CM

## 2022-09-18 DIAGNOSIS — I428 Other cardiomyopathies: Secondary | ICD-10-CM

## 2022-09-18 DIAGNOSIS — Z952 Presence of prosthetic heart valve: Secondary | ICD-10-CM

## 2022-09-18 DIAGNOSIS — R0681 Apnea, not elsewhere classified: Secondary | ICD-10-CM

## 2022-09-18 NOTE — Telephone Encounter (Signed)
[employer] denied the NPSG.  Can you put an order in for HST thank you.

## 2022-09-18 NOTE — Telephone Encounter (Signed)
Noted, thank you.   HST  [employer] Berkley Harvey: [id number] (exp. 09/18/22 to 11/16/22)

## 2022-09-18 NOTE — Telephone Encounter (Signed)
Order placed

## 2022-09-18 NOTE — Addendum Note (Signed)
Addended by: Jacqualine Code D on: 09/18/2022 09:44 AM   Modules accepted: Orders

## 2022-09-25 ENCOUNTER — Ambulatory Visit: Payer: BC Managed Care – PPO | Admitting: Neurology

## 2022-09-25 DIAGNOSIS — G4733 Obstructive sleep apnea (adult) (pediatric): Secondary | ICD-10-CM

## 2022-09-25 DIAGNOSIS — R0681 Apnea, not elsewhere classified: Secondary | ICD-10-CM

## 2022-09-25 DIAGNOSIS — Z952 Presence of prosthetic heart valve: Secondary | ICD-10-CM

## 2022-09-25 DIAGNOSIS — G4731 Primary central sleep apnea: Secondary | ICD-10-CM

## 2022-09-25 DIAGNOSIS — I428 Other cardiomyopathies: Secondary | ICD-10-CM

## 2022-09-25 DIAGNOSIS — R0683 Snoring: Secondary | ICD-10-CM

## 2022-10-02 ENCOUNTER — Ambulatory Visit: Payer: BC Managed Care – PPO | Admitting: Cardiology

## 2022-10-02 DIAGNOSIS — Z952 Presence of prosthetic heart valve: Secondary | ICD-10-CM

## 2022-10-02 LAB — POCT INR: INR: 2.1 (ref 2.0–3.0)

## 2022-10-02 NOTE — Progress Notes (Signed)
See procedure note.

## 2022-10-03 ENCOUNTER — Other Ambulatory Visit: Payer: Self-pay | Admitting: Cardiology

## 2022-10-03 DIAGNOSIS — Z952 Presence of prosthetic heart valve: Secondary | ICD-10-CM

## 2022-10-03 DIAGNOSIS — Z7901 Long term (current) use of anticoagulants: Secondary | ICD-10-CM

## 2022-10-04 NOTE — Addendum Note (Signed)
Addended by: Huston Foley on: 10/04/2022 01:28 PM   Modules accepted: Orders

## 2022-10-04 NOTE — Procedures (Signed)
[facility]  HOME SLEEP TEST (Watch PAT) REPORT  -  Mail-out Device  STUDY DATE: 09/30/2022  DOB: 1970/02/23  MRN: [id number]  ORDERING CLINICIAN: Huston Foley, MD, PhD   REFERRING CLINICIANChilton Greathouse, MD   CLINICAL INFORMATION/HISTORY: 51 year old male with an underlying medical history of aortic stenosis with status post AVR in 2014 and redo in 2023, CHF, cardiomyopathy, history of kidney stones, hyperlipidemia, headaches, erythrocytosis, anxiety and overweight state, who reports snoring and excessive daytime somnolence.   Epworth sleepiness score: 2/24.  BMI: 29.3 kg/m  FINDINGS:   Sleep Summary:   Total Recording Time (hours, min): 8 hours, 27 min  Total Sleep Time (hours, min):  7 hours, 45 min  Percent REM (%):    28.4%   Respiratory Indices:   Calculated pAHI (per hour):  43.4/hour         REM pAHI:    56.1/hour       NREM pAHI: 38.2/hour  Central pAHI: 5.9/hour  Oxygen Saturation Statistics:    Oxygen Saturation (%) Mean: 92%   Minimum oxygen saturation (%):                 84%   O2 Saturation Range (%): 84 - 98%    O2 Saturation (minutes) <=88%: 2.3 min  Pulse Rate Statistics:   Pulse Mean (bpm):    72/min    Pulse Range (48 - 102/min)   IMPRESSION: OSA (obstructive sleep apnea), severe Central Sleep Apnea   RECOMMENDATION:  This home sleep test demonstrates severe obstructive sleep apnea with a total AHI of 43.4/hour and O2 nadir of 84%.  Snoring was detected, in the loud range.  Treatment with positive airway pressure is highly recommended. This will require - ideally - a full night CPAP titration study for proper treatment settings, O2 monitoring and mask fitting. For now, the patient will be advised to proceed with an autoPAP titration/trial at home. A laboratory attended titration study can be considered in the future for optimization of treatment settings and to improve tolerance and compliance. Alternative  treatment options are limited secondary to the severity of the patient's sleep disordered breathing, but may include surgical treatment with an implantable hypoglossal nerve stimulator (in carefully selected candidates, meeting criteria).  Concomitant weight loss is recommended, where clinically appropriate. Please note, that untreated obstructive sleep apnea may carry additional perioperative morbidity. Patients with significant obstructive sleep apnea should receive perioperative PAP therapy and the surgeons and particularly the anesthesiologist should be informed of the diagnosis and the severity of the sleep disordered breathing. The patient should be cautioned not to drive, work at heights, or operate dangerous or heavy equipment when tired or sleepy. Review and reiteration of good sleep hygiene measures should be pursued with any patient. Other causes of the patient's symptoms, including circadian rhythm disturbances, an underlying mood disorder, medication effect and/or an underlying medical problem cannot be ruled out based on this test. Clinical correlation is recommended.  The patient and his referring provider will be notified of the test results. The patient will be seen in follow up in sleep clinic at [facility].  I certify that I have reviewed the raw data recording prior to the issuance of this report in accordance with the standards of the American Academy of Sleep Medicine (AASM).    INTERPRETING PHYSICIAN:   Huston Foley, MD, PhD Medical Director, [facility]) Diplomat, ABPN (Neurology and Sleep)   [facility] [address]  [place], [place] [address] [phone]

## 2022-10-05 ENCOUNTER — Encounter: Payer: Self-pay | Admitting: Cardiology

## 2022-10-05 NOTE — Progress Notes (Signed)
Description   10/02/2022  INR today was 2.1. Patient goal level is (2.0-3.0)  Prior dose was continue with 6 mg Monday, Wednesday, Friday, and 3 mg all the other days.  Patient will start 6 mg Monday, Wednesday, Friday, and 3 mg all the other days.  Pt will recheck INR 6 weeks on  Lab Results      Component                Value               Date                      INR                      2.1                 10/02/2022                INR                      2.6                 08/21/2022                INR                      3.9 (A)             07/25/2022                   Mechanical AVR 27 mm Saint Jude Regent valve and aortic root replacemen 10/16/2021.  (primary encounter diagnosis)       ICD-10-CM   1. Mechanical AVR with 27 mm Saint Jude Regent valve and aortic root replacement with [name] on 10/16/2021.  Z95.2 POCT INR

## 2022-10-07 ENCOUNTER — Telehealth: Payer: Self-pay

## 2022-10-07 NOTE — Telephone Encounter (Signed)
I called pt. I advised pt that Dr. Frances Furbish reviewed their sleep study results and found that pt sever OSA. Dr. Frances Furbish recommends that pt starts autopap. I reviewed PAP compliance expectations with the pt. Pt is agreeable to starting a CPAP. I advised pt that an order will be sent to a DME, [facility], and [facility] will call the pt within about one week after they file with the pt's insurance. [facility] will show the pt how to use the machine, fit for masks, and troubleshoot the CPAP if needed. A follow up appt was made for insurance purposes with megan NP 10/24 VV Pt verbalized understanding to arrive 15 minutes early and bring their CPAP. Pt verbalized understanding of results. Pt had no questions at this time but was encouraged to call back if questions arise. I have sent the order to [facility] and have received confirmation that they have received the order.

## 2022-10-07 NOTE — Telephone Encounter (Addendum)
Bruce [name], CMA  Zott, Stacy; Gamble, Tammy URGENT New orders have been placed for the above pt, DOB: [birth year] Thanks  Zott, Stacy  Hanne Kegg, Abbe Amsterdam, CMA; Melvern Sample Got It Thank Fredrik Cove

## 2022-10-07 NOTE — Telephone Encounter (Signed)
-----   Message from Huston Foley sent at 10/04/2022  1:28 PM EDT ----- Urgent set up requested on PAP therapy, due to severe OSA. Patient referred by PCP, seen by me on 08/28/2022, patient had a HST on 09/30/2022.    Please call and notify the patient that the recent home sleep test showed obstructive sleep apnea in the severe range. I recommend treatment for this in the form of autoPAP, which means, that we don't have to bring him in for a sleep study with CPAP, but will let him start using a so called autoPAP machine at home, through a DME company (of his choice, or as per insurance requirement). The DME representative will fit the patient with a mask of choice, educate him on how to use the machine, how to put the mask on, etc. I have placed an order in the chart. Please send the order to a local DME, talk to patient, send report to referring MD. Please also reinforce the need for compliance with treatment. We will need a FU in sleep clinic for 10 weeks post-PAP set up, please arrange that with me or one of our NPs. Thanks,   Huston Foley, MD, PhD [facility])

## 2022-10-29 ENCOUNTER — Encounter: Payer: Self-pay | Admitting: Pharmacist

## 2022-11-13 ENCOUNTER — Ambulatory Visit: Payer: BC Managed Care – PPO | Attending: Cardiology

## 2022-11-13 DIAGNOSIS — Z952 Presence of prosthetic heart valve: Secondary | ICD-10-CM | POA: Diagnosis not present

## 2022-11-13 DIAGNOSIS — I5022 Chronic systolic (congestive) heart failure: Secondary | ICD-10-CM | POA: Diagnosis not present

## 2022-11-13 LAB — POCT INR: INR: 2.4 (ref 2.0–3.0)

## 2022-11-13 MED ORDER — CARVEDILOL 6.25 MG PO TABS
6.2500 mg | ORAL_TABLET | Freq: Two times a day (BID) | ORAL | Status: DC
Start: 2022-11-13 — End: 2023-10-10

## 2022-11-13 NOTE — Patient Instructions (Addendum)
Description   Continue taking 6 mg Monday, Wednesday, Friday, and 3 mg all the other days. Recheck INR in 4 weeks  Mechanical AVR 27 mm Saint Jude Regent valve and aortic root replacemen 10/16/2021 Coumadin Clinic [phone]     A full discussion of the nature of anticoagulants has been carried out.  A benefit risk analysis has been presented to the patient, so that they understand the justification for choosing anticoagulation at this time. The need for frequent and regular monitoring, precise dosage adjustment and compliance is stressed.  Side effects of potential bleeding are discussed.  The patient should avoid any OTC items containing aspirin or ibuprofen, and should avoid great swings in general diet.  Avoid alcohol consumption.

## 2022-11-21 ENCOUNTER — Telehealth: Payer: Self-pay | Admitting: Neurology

## 2022-11-21 NOTE — Telephone Encounter (Signed)
Pt was scheduled for MyChart Video Visit, Initial CPAP visit on 02/04/23.   DME: [facility] Phone:[phone] [phone] Set up date 11-14-2022 Airsense 10 [place] FFM large  Equipment issues:Airsense 10 on 11/14/22  Pt to be scheduled between 12/13/22-02/11/23

## 2022-12-11 ENCOUNTER — Ambulatory Visit: Payer: BC Managed Care – PPO | Attending: Cardiovascular Disease | Admitting: *Deleted

## 2022-12-11 DIAGNOSIS — Z952 Presence of prosthetic heart valve: Secondary | ICD-10-CM | POA: Diagnosis not present

## 2022-12-11 DIAGNOSIS — Z5181 Encounter for therapeutic drug level monitoring: Secondary | ICD-10-CM | POA: Diagnosis not present

## 2022-12-11 LAB — POCT INR: INR: 2.1 (ref 2.0–3.0)

## 2022-12-11 NOTE — Patient Instructions (Signed)
Description   Continue taking 6 mg Monday, Wednesday, Friday, and 3 mg all the other days. Recheck INR in 4 weeks-Lives in [place], [place]  Mechanical AVR 27 mm Saint Jude Regent valve and aortic root replacemen 10/16/2021 Coumadin Clinic [phone]

## 2022-12-12 ENCOUNTER — Telehealth: Payer: BC Managed Care – PPO | Admitting: Adult Health

## 2023-01-08 ENCOUNTER — Other Ambulatory Visit: Payer: Self-pay

## 2023-01-08 ENCOUNTER — Ambulatory Visit: Payer: BC Managed Care – PPO | Attending: Cardiovascular Disease | Admitting: *Deleted

## 2023-01-08 DIAGNOSIS — I5022 Chronic systolic (congestive) heart failure: Secondary | ICD-10-CM

## 2023-01-08 DIAGNOSIS — Z5181 Encounter for therapeutic drug level monitoring: Secondary | ICD-10-CM | POA: Diagnosis not present

## 2023-01-08 DIAGNOSIS — Z952 Presence of prosthetic heart valve: Secondary | ICD-10-CM

## 2023-01-08 LAB — POCT INR: INR: 2.5 (ref 2.0–3.0)

## 2023-01-08 MED ORDER — VALSARTAN 160 MG PO TABS: 80.0000 mg | ORAL_TABLET | Freq: Two times a day (BID) | ORAL | 2 refills | Status: DC

## 2023-01-08 NOTE — Patient Instructions (Signed)
Description   Continue taking 6 mg Monday, Wednesday, Friday, and 3 mg all the other days. Recheck INR in 6 weeks-Lives in [place], [place]  Mechanical AVR 27 mm Saint Jude Regent valve and aortic root replacemen 10/16/2021 Coumadin Clinic [phone]

## 2023-01-24 ENCOUNTER — Other Ambulatory Visit: Payer: Self-pay | Admitting: Cardiology

## 2023-01-24 DIAGNOSIS — Z952 Presence of prosthetic heart valve: Secondary | ICD-10-CM

## 2023-01-24 DIAGNOSIS — Z7901 Long term (current) use of anticoagulants: Secondary | ICD-10-CM

## 2023-01-24 NOTE — Telephone Encounter (Signed)
Warfarin 3mg  refill Mechanical AVR with 27 mm Saint Jude Regent valve and aortic root replacement with [name] on 10/16/2021  Last INR 01/08/23 Last OV 08/21/22

## 2023-02-04 ENCOUNTER — Ambulatory Visit: Payer: BC Managed Care – PPO | Admitting: Neurology

## 2023-02-04 ENCOUNTER — Encounter: Payer: Self-pay | Admitting: Neurology

## 2023-02-04 VITALS — BP 107/62 | HR 62 | Ht 72.0 in | Wt 230.0 lb

## 2023-02-04 DIAGNOSIS — R0789 Other chest pain: Secondary | ICD-10-CM | POA: Diagnosis not present

## 2023-02-04 DIAGNOSIS — G4733 Obstructive sleep apnea (adult) (pediatric): Secondary | ICD-10-CM

## 2023-02-04 DIAGNOSIS — G8918 Other acute postprocedural pain: Secondary | ICD-10-CM

## 2023-02-04 NOTE — Patient Instructions (Addendum)
It was nice to see you again today. I am glad to hear, things are going well with your autoPAP therapy. You have adjusted well to treatment with your new machine, and you are compliant with it. You have also fulfilled the insurance-mandated compliance percentage, which is reassuring, so you can get ongoing supplies through your insurance. Please talk to your DME provider about getting replacement supplies on a regular basis. Please be sure to change your filter every month, your mask about every 3 months, hose about every 6 months, humidifier chamber about yearly. Some restrictions are imposed by your insurance carrier with regard to how frequently you can get certain supplies.  Your DME company can provide further details if necessary.   Please continue using your autoPAP regularly. While your insurance requires that you use PAP at least 4 hours each night on 70% of the nights, I recommend, that you not skip any nights and use it throughout the night if you can. Getting used to PAP and staying with the treatment long term does take time and patience and discipline. Untreated obstructive sleep apnea when it is moderate to severe can have an adverse impact on cardiovascular health and raise her risk for heart disease, arrhythmias, hypertension, congestive heart failure, stroke and diabetes. Untreated obstructive sleep apnea causes sleep disruption, nonrestorative sleep, and sleep deprivation. This can have an impact on your day to day functioning and cause daytime sleepiness and impairment of cognitive function, memory loss, mood disturbance, and problems focussing. Using PAP regularly can improve these symptoms.  We can see you in 1 year, you can see one of our nurse practitioners as you are stable.   You can keep [place] posted as to how you are doing especially with regards to your chest wall discomfort and how you are sleeping.  You can utilize melatonin as needed at night.  We can consider increasing the  maximum pressure from 12 to 13 cm in the near future, we can review a download and a few months, after you have had a follow-up with your surgeon.  You can keep [place] posted by sending [place] a MyChart message.

## 2023-02-04 NOTE — Progress Notes (Signed)
Subjective:    Patient ID: Bruce [name] is a 52 y.o. male.  HPI    Interim history:   Bruce [name] is a 52 year old male with an underlying medical history of aortic stenosis with status post AVR in 2014 and redo in 2023, CHF, cardiomyopathy, history of kidney stones, hyperlipidemia, headaches, erythrocytosis, anxiety and overweight state, who presents for follow-up consultation of his obstructive sleep apnea after interim testing and starting home PAP therapy.  The patient is unaccompanied today.  I first met him at the request of his primary care physician on 08/28/2022, at which time he reported snoring and nonrestorative sleep.  He complained of difficulty initiating and maintaining sleep.  He was advised to proceed with a sleep study.  He had a home sleep test through our office on 09/30/2022 which showed severe obstructive sleep apnea with an AHI of 43.4/h, O2 nadir 84% with significant snoring detected.  He was advised to proceed with home AutoPap therapy.  His set up date was 11/14/2022.  He has a ResMed air sense 10 AutoSet machine.  His DME provider is [facility].   Today, 02/04/2023, I reviewed his AutoPap compliance data from 01/04/2023 through 02/02/2023, which is a total of 30 days, during which time he used his machine 23 days with percent use days greater than 4 hours at 47%, indicating suboptimal compliance, average usage for days on treatment of 4 hours and 47 minutes, residual AHI mildly elevated at 7.8/h, 95th percentile of pressure at 11.7 cm with a range of 6 to 12 cm with EPR of 3.  Leak on the low side with the 95th percentile at 2.8 L/min. In the first month of treatment, between 11/14/2022 through 12/13/2022, he was at 100% compliant with an average usage of 6 hours and 10 minutes, residual AHI mildly elevated at 7.9/h with the 95th percentile of pressure at 11.7 cm. He reports that he is still adjusting to treatment.  He has difficulty sleeping at night, primarily owing to chest  wall discomfort in his sternum.  He has an appointment with his cardiothoracic surgeon in February 2025 to discuss treatment options for this.  He takes narcotic pain medication, about 1 pill a day, typically at bedtime to alleviate pain at night.  Lately, he has had a tendency to pull the mask off in the middle of the night without realizing it, when he is able to sleep at least 6 hours with the AutoPap he actually feels better.  He is motivated to continue with treatment. He did not take his machine when he went to [place] recently.  The patient's allergies, current medications, family history, past medical history, past social history, past surgical history and problem list were reviewed and updated as appropriate.   Previously:  08/28/2022: (He) reports snoring and excessive daytime somnolence.  His Epworth sleepiness score is 2 out of 24, fatigue severity score is 43 out of 63.  He does not wake up rested, he has some trouble going to sleep and staying asleep.  Since his aortic valve surgery last year he has had discomfort in the chest wall, he has had pain and usually ends up taking oxycodone, 1/day.  He has had sternal nonunion and was offered the possibility of rewiring but has been reluctant to pursue another surgical procedure.  He is on Coumadin.  He takes Xanax at bedtime, 0.5 mg strength.  I reviewed your office note from 07/11/2022.  He does snore and snoring has become worse per partner.  He has had some apneas which were witnessed.  He denies recurrent morning headaches, he does have nocturia about once or twice per average night.  Bedtime is generally between 10 and 11 PM and rise time between 5 and 6.  He is currently in between jobs, he will start a new job in August 2024.  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  He has no TV in his bedroom, he does not share his bedroom currently, he they have 2 dogs and 1 cat in the household and the pets do not sleep with him.  He does not drink any alcohol, he quit  smoking in 2014.  He drinks caffeine in the form of soda, 2 to 3 cans/day.  He has no children and lives with his partner.  His Past Medical History Is Significant For: Past Medical History:  Diagnosis Date   Anxiety    Xanax prn and takes at night to sleep   Aortic regurgitation    Aortic stenosis    Arthritis    back and neck   Cardiomyopathy (HCC)    Headache    with vision loss in one eye- MRI brain 5/08- tiny acute nonhemorrhagic infarct?, minimal nonspecific white matter type changes, minimal branch vessel irregularity, also DG eye- negative foreign body   Heart murmur    bicuspid valve   History of kidney stones    Hyperlipidemia    takes Atorvastatin daily   Joint swelling    Mechanical AVR with 27 mm Saint Jude Regent valve and aortic root replacement with [name] on 10/16/2021. 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 Jude Regent) AV replacemnt on 10/16/21 10/16/2021   S/P minimally invasive aortic valve replacement with bioprosthetic valve 10/28/2012   23mm Edwards Magna Ease bovine pericardial tissue valve placed via right anterior mini-thoracotomy approach   Seasonal allergies    Shortness of breath    when eating    His Past Surgical History Is Significant For: Past Surgical History:  Procedure Laterality Date   AORTIC VALVE REPLACEMENT N/A 10/28/2012   Procedure: MINIMALLY INVASIVE AORTIC VALVE REPLACEMENT (AVR);  Surgeon: Purcell Nails, MD;  Location: [facility] OR;  Service: Open Heart Surgery;  Laterality: N/A;   AORTIC VALVE REPLACEMENT  10/15/2021   BACK SURGERY     cyst removed from back of spine     INTRAOPERATIVE TRANSESOPHAGEAL ECHOCARDIOGRAM N/A 10/28/2012   Procedure: INTRAOPERATIVE TRANSESOPHAGEAL ECHOCARDIOGRAM;  Surgeon:  Purcell Nails, MD;  Location: [facility] OR;  Service: Open Heart Surgery;  Laterality: N/A;   LEFT AND RIGHT HEART CATHETERIZATION WITH CORONARY ANGIOGRAM N/A 09/08/2012   Procedure: LEFT AND RIGHT HEART CATHETERIZATION WITH CORONARY ANGIOGRAM;  Surgeon: Pamella Pert, MD;  Location: [facility] CATH LAB;  Service: Cardiovascular;  Laterality: N/A;   LITHOTRIPSY     right hand surgery     RIGHT HEART CATH AND CORONARY ANGIOGRAPHY N/A 07/03/2021   Procedure: RIGHT HEART CATH AND CORONARY ANGIOGRAPHY;  Surgeon: Elder Negus, MD;  Location: [facility] INVASIVE CV LAB;  Service: Cardiovascular;  Laterality: N/A;   SHOULDER SURGERY  06/2020   TEE WITHOUT CARDIOVERSION N/A 09/01/2012   Procedure: TRANSESOPHAGEAL ECHOCARDIOGRAM (TEE);  Surgeon: Pamella Pert, MD;  Location: [facility] ENDOSCOPY;  Service: Cardiovascular;  Laterality: N/A;  h&p in file-[facility]  His Family History Is Significant For: Family History  Problem Relation Age of Onset   Cancer Mother        brain, deceased age 28    Brain cancer Mother    COPD Mother    Gout Brother     His Social History Is Significant For: Social History   Socioeconomic History   Marital status: Single    Spouse name: Not on file   Number of children: 0   Years of education: Not on file   Highest education level: Not on file  Occupational History   Not on file  Tobacco Use   Smoking status: Former    Current packs/day: 0.00    Average packs/day: 0.5 packs/day for 20.0 years (10.0 ttl pk-yrs)    Types: Cigarettes    Start date: 44    Quit date: 2016    Years since quitting: 8.9   Smokeless tobacco: Never  Vaping Use   Vaping status: Never Used  Substance and Sexual Activity   Alcohol use: Not Currently    Alcohol/week: 1.0 - 2.0 standard drink of alcohol    Types: 1 - 2 Shots of liquor per week   Drug use: No   Sexual activity: Not on file  Other Topics Concern   Not on file  Social History Narrative   Caffeine: 3 cups/day   Social  Drivers of Corporate investment banker Strain: Not on file  Food Insecurity: Not on file  Transportation Needs: No Transportation Needs (10/16/2021)   Received [facility], [facility]   PRAPARE - Transportation    In the past 12 months, has lack of transportation kept you from medical appointments or from getting medications?: No    Lack of Transportation (Non-Medical): No  Physical Activity: Not on file  Stress: Not on file  Social Connections: Not on file    His Allergies Are:  Allergies  Allergen Reactions   Prednisone Swelling and Other (See Comments)    Makes him crazy   Serzone [Nefazodone Hcl] Swelling and Other (See Comments)    Makes him crazy  :   His Current Medications Are:  Outpatient Encounter Medications as of 02/04/2023  Medication Sig   ALPRAZolam (XANAX) 0.5 MG tablet Take 1 tablet by mouth as needed.   atorvastatin (LIPITOR) 80 MG tablet Take 1 tablet (80 mg total) by mouth daily.   carvedilol (COREG) 6.25 MG tablet Take 1 tablet (6.25 mg total) by mouth 2 (two) times daily.   oxyCODONE (OXY IR/ROXICODONE) 5 MG immediate release tablet Take 1 tablet by mouth 2 (two) times daily as needed for severe pain.   valsartan (DIOVAN) 160 MG tablet Take 0.5 tablets (80 mg total) by mouth 2 (two) times daily.   warfarin (COUMADIN) 3 MG tablet Take 1 tablet (3mg ) on Sunday, Tuesday, Thursday, Saturday by mouth and use 6mg  tablet all other day or as directed by Anticoagulation Clinic   warfarin (COUMADIN) 6 MG tablet TAKE 1 TABLET (6 MG TOTAL) BY MOUTH AS DIRECTED. ONE TAB EVERY EVENING AS DIRECTED   No facility-administered encounter medications on file as of 02/04/2023.  :  Review of Systems:  Out of a complete 14 point review of systems, all are reviewed and negative with the exception of these symptoms as listed below  Review of Systems  Neurological:        Patient is here alone for initial cpap follow-up. He feels things  are going fine on  it. He states it may be a little soon to tell if he's feeling well rested during the day. ESS 3    Objective:  Neurological Exam  Physical Exam Physical Examination:   Vitals:   02/04/23 0948  BP: 107/62  Pulse: 62    General Examination: The patient is a very pleasant 52 y.o. male in no acute distress. He appears well-developed and well-nourished and well groomed.   HEENT: Normocephalic, atraumatic, pupils are equal, round and reactive to light, corrective eyeglasses in place.  Tracking well-preserved.  Face is symmetric with normal facial animation.  Hearing grossly intact.  Airway examination reveals mild to moderate mouth dryness, otherwise stable findings.  Tongue protrudes centrally and palate elevates symmetrically.  No carotid bruits.   Chest: Clear to auscultation without wheezing, rhonchi or crackles noted.   Heart: S1+S2+0, regular and normal without murmurs, loud valve click noted.    Abdomen: Soft, non-tender and non-distended.   Extremities: There is no pitting edema in the distal lower extremities bilaterally.    Skin: Warm and dry without trophic changes noted.    Musculoskeletal: exam reveals no obvious joint deformities.    Neurologically:  Mental status: The patient is awake, alert and oriented in all 4 spheres. His immediate and remote memory, attention, language skills and fund of knowledge are appropriate. There is no evidence of aphasia, agnosia, apraxia or anomia. Speech is clear with normal prosody and enunciation. Thought process is linear. Mood is normal and affect is normal.  Cranial nerves II - XII are as described above under HEENT exam.  Motor exam: Normal bulk, strength and tone is noted. There is no obvious action or resting tremor.  Fine motor skills and coordination: grossly intact.  Cerebellar testing: No dysmetria or intention tremor. There is no truncal or gait ataxia.  Sensory exam: intact to light touch in the upper and  lower extremities.  Gait, station and balance: He stands easily. No veering to one side is noted. No leaning to one side is noted. Posture is age-appropriate and stance is narrow based. Gait shows normal stride length and normal pace. No problems turning are noted.    Assessment and Plan:  In summary, GLENVILLE ARACE is a 52 year old male with an underlying medical history of aortic stenosis with status post AVR in 2014 and redo in 2023, CHF, cardiomyopathy, history of kidney stones, hyperlipidemia, headaches, erythrocytosis, anxiety and overweight state, who presents for follow-up consultation of his obstructive sleep apnea after interim testing and starting home PAP therapy.  He had a home sleep test through our office on 09/30/2022 which showed severe obstructive sleep apnea with an AHI of 43.4/h, O2 nadir 84% with significant snoring detected.  He has been on home AutoPap therapy since 11/14/2022.  He was fully compliant in the first month and a half approximately.  He had some lapses in treatment, currently has had more trouble with his chest wall pain.  He has an appointment with his cardiothoracic surgeon at [facility] in February 2025.  He is encouraged to trial melatonin again at night but he would like to hold off for now.  We discussed pushing the maximum pressure on his AutoPap machine 13 cm with he would like to hold off for now until he feels a little better.  This is certainly acceptable.  He is advised to stay in touch with [place] via MyChart messaging and he can always email [place] through MyChart with regards to reviewing another download in the near future  and may be adjusting his pressure range.  He is commended for his treatment adherence.  He has noted some benefit already.  He is certainly motivated to continue with AutoPap therapy.  He is advised to follow-up routinely in sleep clinic to see one of our nurse practitioners in 1 year, sooner if needed.  I answered all his questions today and we reviewed  his compliance data in detail today.  He was in agreement with our approach. I spent 30 minutes in total face-to-face time and in reviewing records during pre-charting, more than 50% of which was spent in counseling and coordination of care, reviewing test results, reviewing medications and treatment regimen and/or in discussing or reviewing the diagnosis of OSA, the prognosis and treatment options. Pertinent laboratory and imaging test results that were available during this visit with the patient were reviewed by me and considered in my medical decision making (see chart for details).

## 2023-02-17 ENCOUNTER — Ambulatory Visit: Payer: BC Managed Care – PPO | Attending: Internal Medicine

## 2023-02-17 DIAGNOSIS — Z952 Presence of prosthetic heart valve: Secondary | ICD-10-CM

## 2023-02-17 DIAGNOSIS — Z5181 Encounter for therapeutic drug level monitoring: Secondary | ICD-10-CM | POA: Diagnosis not present

## 2023-02-17 LAB — POCT INR: INR: 2.3 (ref 2.0–3.0)

## 2023-02-17 NOTE — Patient Instructions (Signed)
Continue taking 6 mg Monday, Wednesday, Friday, and 3 mg all the other days. Recheck INR in 8 weeks-Lives in [place], [place]  Mechanical AVR 27 mm Saint Jude Regent valve and aortic root replacemen 10/16/2021 Coumadin Clinic [phone]

## 2023-02-21 ENCOUNTER — Telehealth: Payer: BC Managed Care – PPO | Admitting: Adult Health

## 2023-03-08 ENCOUNTER — Other Ambulatory Visit: Payer: Self-pay | Admitting: Cardiology

## 2023-03-08 DIAGNOSIS — Z7901 Long term (current) use of anticoagulants: Secondary | ICD-10-CM

## 2023-03-08 DIAGNOSIS — Z952 Presence of prosthetic heart valve: Secondary | ICD-10-CM

## 2023-03-31 ENCOUNTER — Other Ambulatory Visit: Payer: Self-pay

## 2023-03-31 DIAGNOSIS — Z952 Presence of prosthetic heart valve: Secondary | ICD-10-CM

## 2023-03-31 DIAGNOSIS — Z7901 Long term (current) use of anticoagulants: Secondary | ICD-10-CM

## 2023-03-31 MED ORDER — WARFARIN SODIUM 3 MG PO TABS: ORAL_TABLET | ORAL | 1 refills | Status: AC

## 2023-04-02 ENCOUNTER — Other Ambulatory Visit: Payer: Self-pay

## 2023-04-02 MED ORDER — ATORVASTATIN CALCIUM 80 MG PO TABS: 80.0000 mg | ORAL_TABLET | Freq: Every day | ORAL | 1 refills | Status: DC

## 2023-04-14 ENCOUNTER — Ambulatory Visit: Payer: BC Managed Care – PPO | Attending: Cardiology

## 2023-04-14 DIAGNOSIS — Z952 Presence of prosthetic heart valve: Secondary | ICD-10-CM

## 2023-04-14 LAB — POCT INR: INR: 2 (ref 2.0–3.0)

## 2023-04-14 NOTE — Patient Instructions (Signed)
 Description   Take 9mg  today and then continue taking 6 mg Monday, Wednesday, Friday, and 3 mg all the other days. Recheck INR in 8 weeks-Lives in [place], [place]  Mechanical AVR 27 mm Saint Jude Regent valve and aortic root replacemen 10/16/2021 [facility]in Clinic [phone]

## 2023-06-09 ENCOUNTER — Ambulatory Visit: Payer: BC Managed Care – PPO | Attending: Cardiology

## 2023-06-09 DIAGNOSIS — Z5181 Encounter for therapeutic drug level monitoring: Secondary | ICD-10-CM

## 2023-06-09 DIAGNOSIS — Z952 Presence of prosthetic heart valve: Secondary | ICD-10-CM | POA: Diagnosis not present

## 2023-06-09 LAB — POCT INR: INR: 3.1 — AB (ref 2.0–3.0)

## 2023-06-09 NOTE — Patient Instructions (Signed)
 continue taking 6 mg Monday, Wednesday, Friday, and 3 mg all the other days. Eat greens tonight. Recheck INR in 8 weeks-Lives in [place], [place]  Mechanical AVR 27 mm Saint Jude Regent valve and aortic root replacemen 10/16/2021 Coumadin  Clinic [phone]

## 2023-08-04 ENCOUNTER — Ambulatory Visit: Attending: Cardiology

## 2023-08-04 DIAGNOSIS — Z5181 Encounter for therapeutic drug level monitoring: Secondary | ICD-10-CM | POA: Diagnosis not present

## 2023-08-04 DIAGNOSIS — Z952 Presence of prosthetic heart valve: Secondary | ICD-10-CM | POA: Diagnosis not present

## 2023-08-04 LAB — POCT INR: INR: 2.6 (ref 2.0–3.0)

## 2023-08-04 NOTE — Patient Instructions (Signed)
 continue taking 6 mg Monday, Wednesday, Friday, and 3 mg all the other days. Eat greens tonight. Recheck INR in 8 weeks-Lives in [place], [place]  Mechanical AVR 27 mm Saint Jude Regent valve and aortic root replacemen 10/16/2021 Coumadin  Clinic [phone]

## 2023-08-14 ENCOUNTER — Ambulatory Visit: Payer: BC Managed Care – PPO | Admitting: Cardiology

## 2023-08-17 ENCOUNTER — Other Ambulatory Visit: Payer: Self-pay | Admitting: Cardiology

## 2023-08-17 DIAGNOSIS — Z952 Presence of prosthetic heart valve: Secondary | ICD-10-CM

## 2023-08-17 DIAGNOSIS — Z7901 Long term (current) use of anticoagulants: Secondary | ICD-10-CM

## 2023-08-25 IMAGING — CT CT ANGIO CHEST
2 of 5 series · 18 of 46 positions shown · IV contrast (Omni 300)
Comparison: 09/18/2012

CLINICAL DATA: 50-year-old male with a history of prior aortic
valve repair

EXAM:
CT ANGIOGRAPHY CHEST WITH CONTRAST
TECHNIQUE: Multidetector CT imaging of the chest was performed using the
standard protocol during bolus administration of intravenous
contrast. Multiplanar CT image reconstructions and MIPs were
obtained to evaluate the vascular anatomy.

[Series 5: thoracic cta 2mm · axial · 0.75mm/px · z∈[+1074,+1372]mm · 15 of 166 slices shown]
[im 11/166  lung]
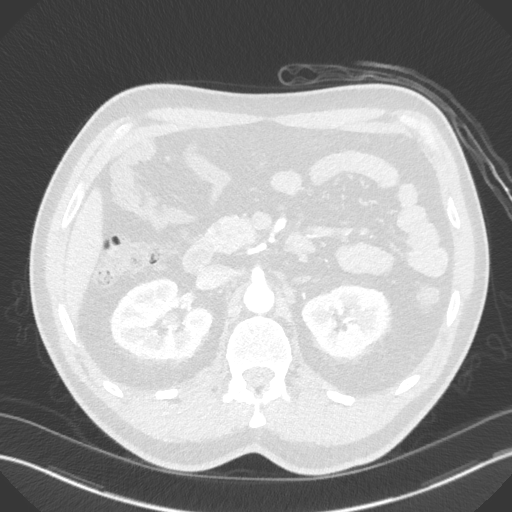
[im 22/166  soft-tissue]
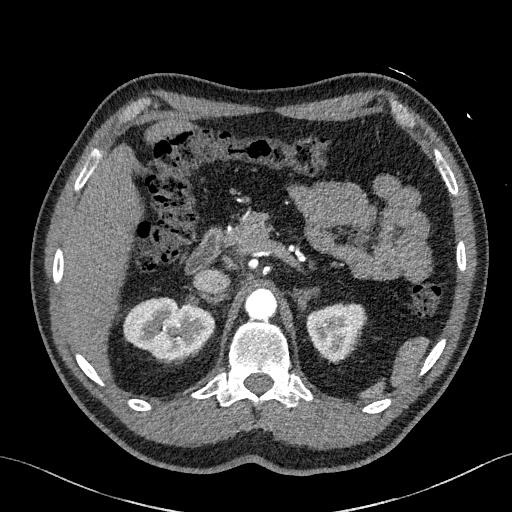
[im 32/166  lung]
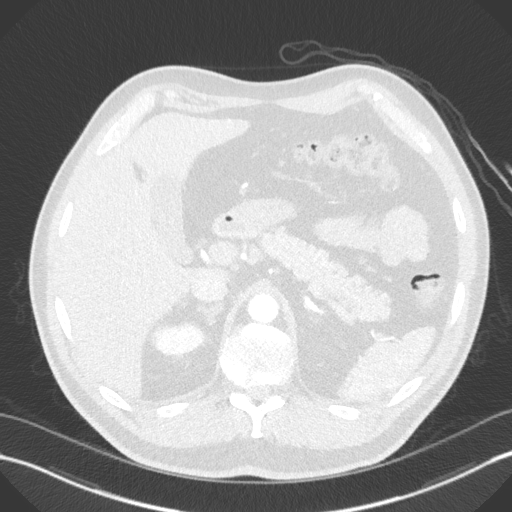
[im 43/166  soft-tissue]
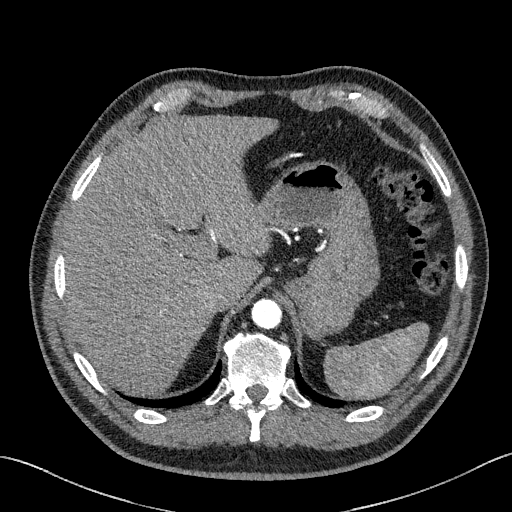
[im 54/166  lung]
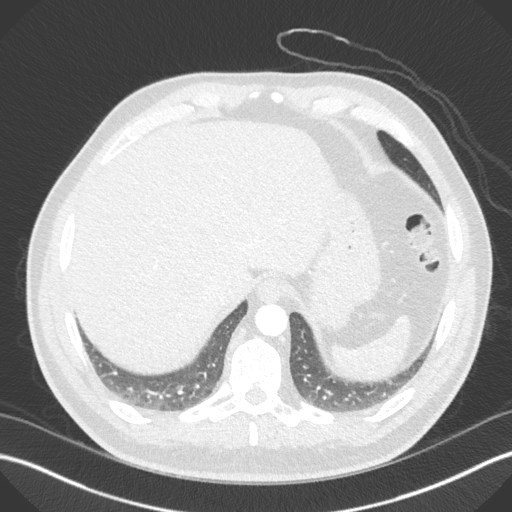
[im 64/166  soft-tissue]
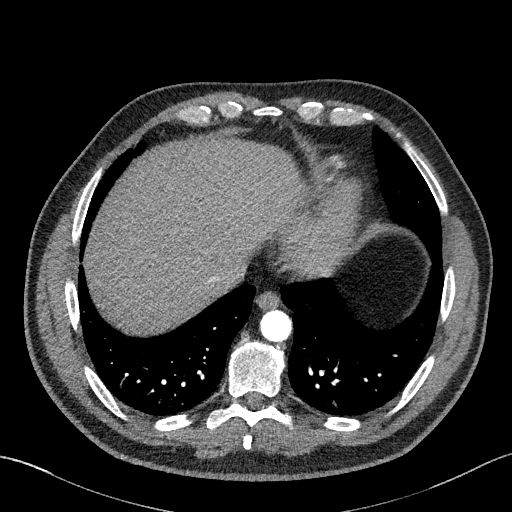
[im 75/166  lung]
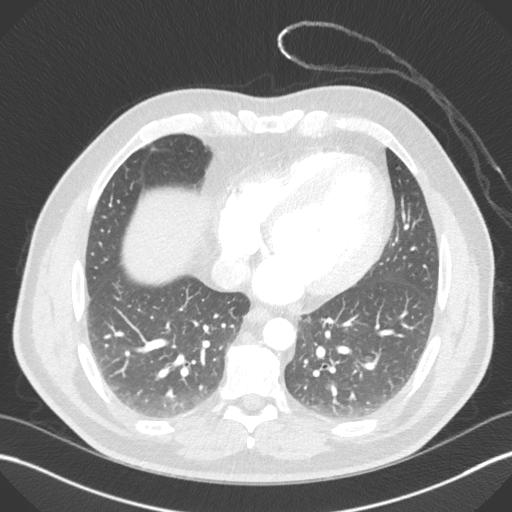
[im 86/166  soft-tissue]
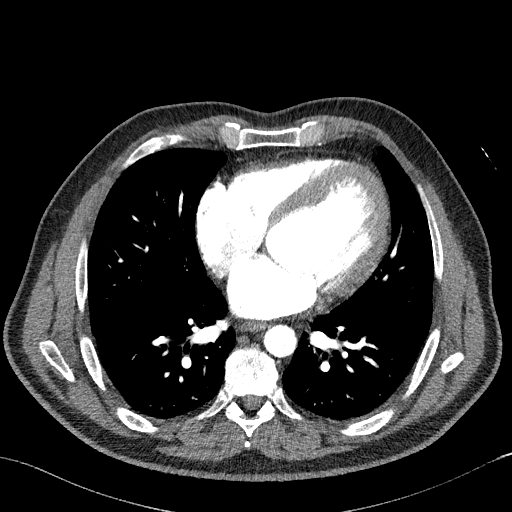
[im 96/166  lung]
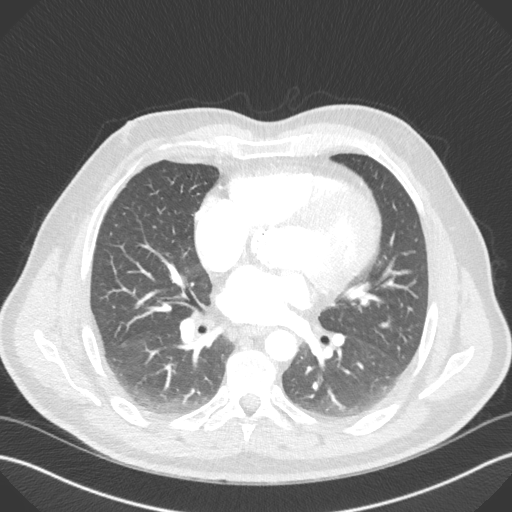
[im 107/166  soft-tissue]
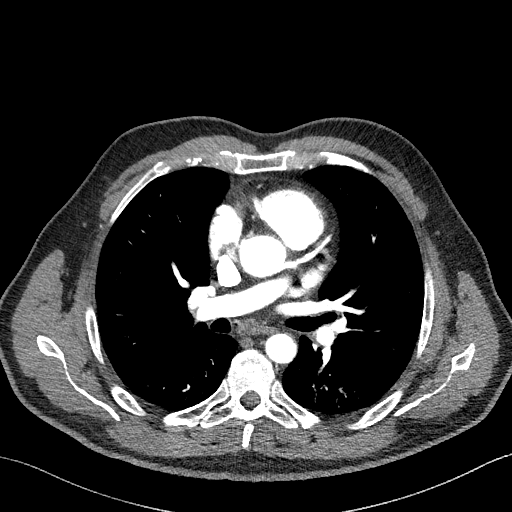
[im 118/166  lung]
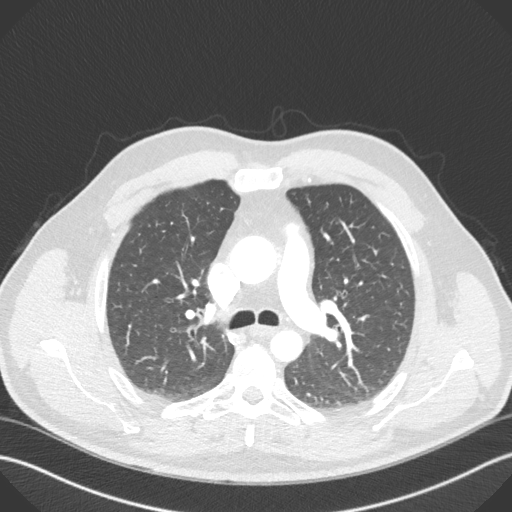
[im 128/166  soft-tissue]
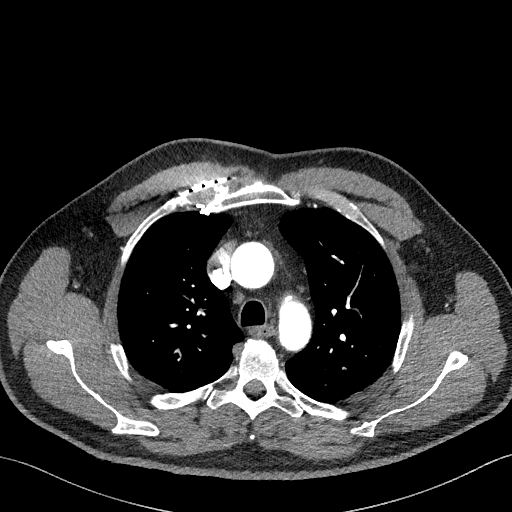
[im 139/166  lung]
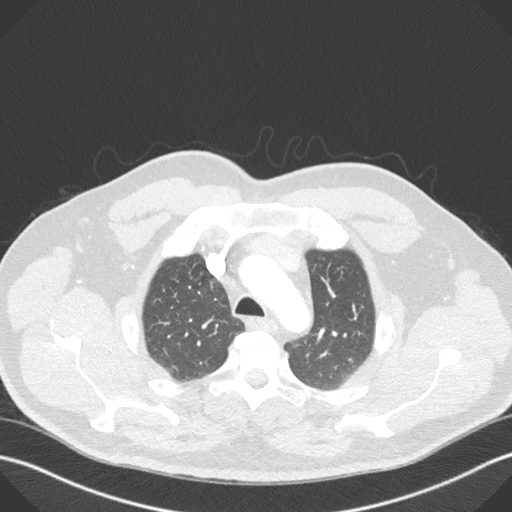
[im 150/166  soft-tissue]
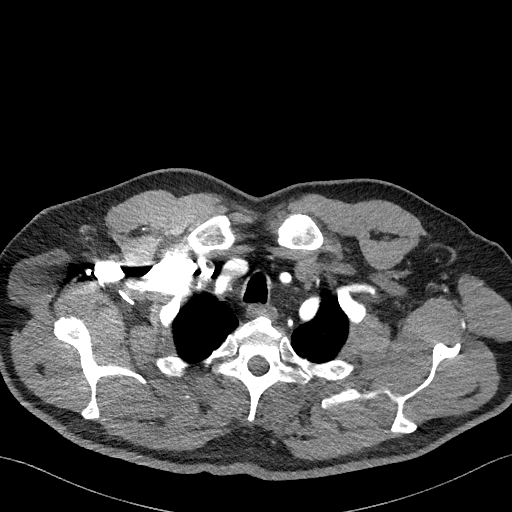
[im 160/166  lung]
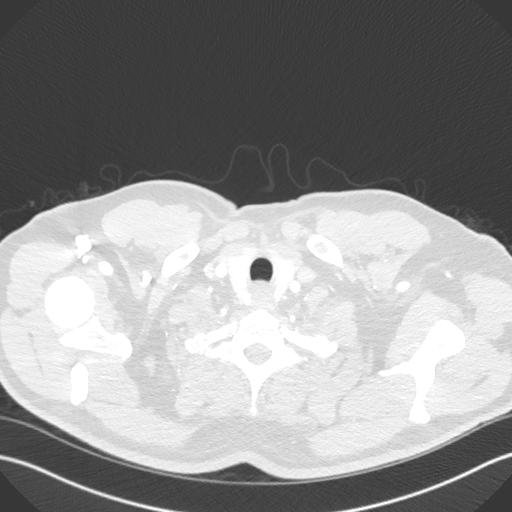

[Series 8: thoracic cta 2mm cor · coronal · 0.64mm/px · 3 of 151 slices shown]
[im 38/151  soft-tissue]
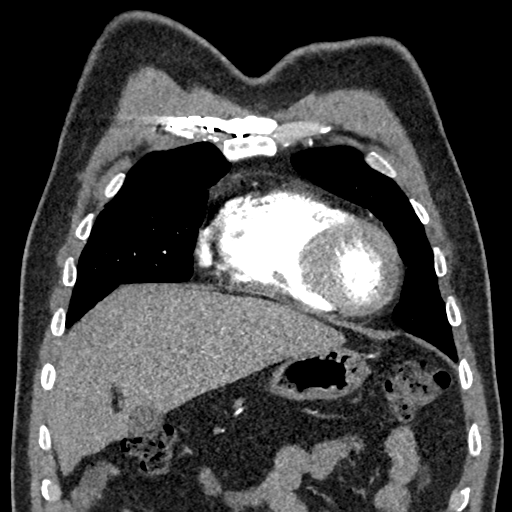
[im 76/151  soft-tissue]
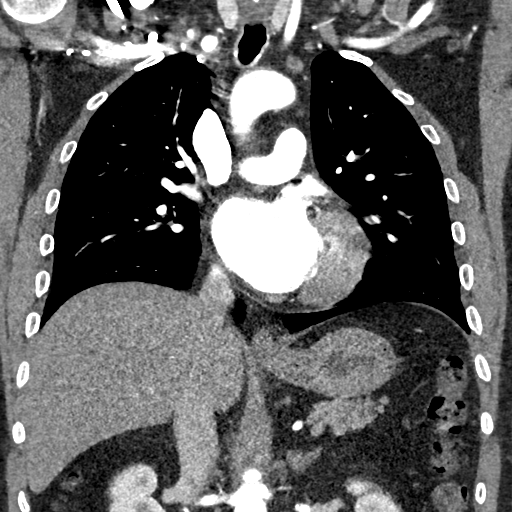
[im 113/151  soft-tissue]
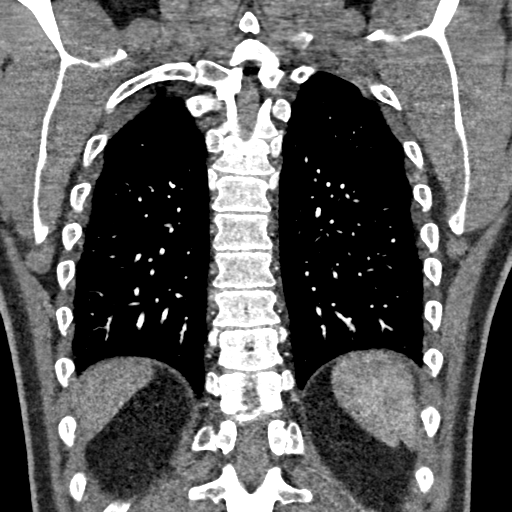

[18 of 46 positions shown; findings below may reference images not displayed]

RADIATION DOSE REDUCTION: This exam was performed according to the
departmental dose-optimization program which includes automated
exposure control, adjustment of the mA and/or kV according to
patient size and/or use of iterative reconstruction technique.

CONTRAST:  100mL OMNIPAQUE IOHEXOL 350 MG/ML SOLN
FINDINGS: Cardiovascular:

Heart:

No cardiomegaly. No pericardial fluid/thickening. Surgical changes
of mini thoracotomy and aortic valve repair.

Minimal calcifications of left anterior descending coronary artery.

Aorta:

Surgical changes of mini thoracotomy and aortic valve repair.
Unremarkable appearance of the surgical site. No anastomotic
aneurysm/pseudoaneurysm.

Greatest estimated diameter of the aortic annulus 27 mm on the
coronal reformatted images.

Greatest estimated diameter of the Juxtcalme junction, 31 mm on
the coronal images.

Greatest estimated diameter of the ascending aorta on the axial
images, 34 mm.

No significant atherosclerotic changes of the aorta. Branch vessels
are patent with common origin of the innominate artery and the left
common carotid artery. Cervical cerebral vessels patent at the base
of the neck.

No pedunculated plaque, ulcerated plaque, dissection, periaortic
fluid. No wall thickening.

Pulmonary arteries:

Timing of the contrast bolus is not optimized for evaluation of
pulmonary artery filling defects. Unremarkable size of the main
pulmonary artery.

Mediastinum/Nodes: No mediastinal adenopathy. Unremarkable
appearance of the thoracic esophagus.

Unremarkable appearance of the thoracic inlet.

Lungs/Pleura: Central airways are clear. No pleural effusion. No
confluent airspace disease.

No pneumothorax.

Upper Abdomen: No acute finding of the upper abdomen. Decreased
attenuation of liver parenchyma, compatible with steatosis.
Incidental finding of celicomesenteric trunk.

Musculoskeletal: No acute displaced fracture. Degenerative changes
of the spine.

Review of the MIP images confirms the above findings.
IMPRESSION: Postsurgical changes of mini thoracotomy and aortic valve repair,
with no late complicating features.

Coronary artery disease.

Additional ancillary findings as above.

## 2023-09-19 ENCOUNTER — Other Ambulatory Visit: Payer: Self-pay | Admitting: Cardiology

## 2023-09-29 ENCOUNTER — Encounter

## 2023-09-29 ENCOUNTER — Encounter: Payer: Self-pay | Admitting: Cardiology

## 2023-09-29 ENCOUNTER — Ambulatory Visit: Attending: Cardiology | Admitting: Cardiology

## 2023-09-29 ENCOUNTER — Ambulatory Visit (INDEPENDENT_AMBULATORY_CARE_PROVIDER_SITE_OTHER)

## 2023-09-29 VITALS — BP 127/77 | HR 60 | Resp 16 | Ht 72.0 in | Wt 230.7 lb

## 2023-09-29 DIAGNOSIS — Z5181 Encounter for therapeutic drug level monitoring: Secondary | ICD-10-CM

## 2023-09-29 DIAGNOSIS — Z952 Presence of prosthetic heart valve: Secondary | ICD-10-CM

## 2023-09-29 DIAGNOSIS — Q2381 Bicuspid aortic valve: Secondary | ICD-10-CM

## 2023-09-29 DIAGNOSIS — Z2989 Encounter for other specified prophylactic measures: Secondary | ICD-10-CM | POA: Diagnosis not present

## 2023-09-29 DIAGNOSIS — I428 Other cardiomyopathies: Secondary | ICD-10-CM

## 2023-09-29 LAB — POCT INR: INR: 2.6 (ref 2.0–3.0)

## 2023-09-29 NOTE — Progress Notes (Signed)
 Cardiology Office Note:  .   Date:  09/29/2023  ID:  Bruce [name], DOB Nov 13, 1970, MRN [id number] PCP: Janey Santos, MD  [facility] Providers Cardiologist:  Gordy Bergamo, MD   History of Present Illness: .   Bruce [name] is a 53 y.o. male with congenital bicuspid aortic valve undergone aortic valve replacement with a bioprosthetic valve by minimally invasive approach on 10/28/2012 with implantation of a [facility] Ease 23 mm pericardial valve. 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  He had mild sternal dehiscence however remains asymptomatic it is left alone.  No redo wiring was performed, was seen by CT surgery on 06/26/2023.   Cardiac Studies relevent.    Echocardiogram 06/26/2023:  Mild LV systolic dysfunction, estimated EF 50%, calculated EF 44%, 3D EF 42%.  Normal diastolic function. Trivial aortic regurgitation.  Mechanical Saint Jude 27 mm prosthetic aortic valve functioning normally.  Mean gradient 7 mmHg.  Dimensionless index 0.45.  Discussed the use of AI scribe software for clinical note transcription with the patient, who gave verbal consent to proceed.  History of Present Illness Bruce [name] is a 53 year old male with a history of bicuspid aortic valve and aortic valve replacement who presents for follow-up on his cardiac condition.  He underwent aortic valve and root replacement on October 16, 2021, due to a bicuspid aortic valve and aortic regurgitation. Annual echocardiograms monitor the aortic valve and root replacement, as well as his mildly reduced left ventricular ejection fraction, which is stable at 45% by 3D.  He is on atorvastatin  80 mg daily for cholesterol management, with an LDL of 58. He takes valsartan  80 mg twice daily due to intolerance to Entresto ,  and is also on carvedilol . He experiences fatigue, particularly after taking warfarin, and is advised to monitor his blood pressure during these episodes. He has sleep apnea and uses a CPAP machine intermittently, noting improved energy levels with consistent use.  Labs   Care everywhere/Faxed External Labs:  Reviewed labs including CMP, CBC, lipid profile testing from PCP via patient portal, normal labs.  LDL is 58, low HDL noted.  ROS  Review of Systems  Cardiovascular:  Negative for chest pain, dyspnea on exertion and leg swelling.   Physical Exam:   VS:  BP 127/77 (BP Location: Left Arm, Patient Position: Sitting, Cuff Size: Large)   Pulse 60   Resp 16   Ht 6' (1.829 m)   Wt 230 lb 11.2 oz (104.6 kg)   SpO2 99%   BMI 31.29 kg/m    Wt Readings from Last 3 Encounters:  09/29/23 230 lb 11.2 oz (104.6 kg)  02/04/23 230 lb (104.3 kg)  08/28/22 215 lb (97.5 kg)    BP Readings from Last 3 Encounters:  09/29/23 127/77  02/04/23 107/62  08/28/22 103/67   Physical Exam Vitals reviewed.  Neck:     Vascular: No carotid bruit (soft bruit (conducted0 bilateral.) or JVD.  Cardiovascular:     Rate and Rhythm: Normal rate and regular rhythm.     Pulses: Normal pulses and intact distal pulses.     Heart sounds: No murmur heard.    No gallop.     Comments: Crisp mechanical AV click Pulmonary:     Effort: Pulmonary effort is normal. No accessory muscle usage or respiratory distress.  Breath sounds: Normal breath sounds.  Abdominal:     General: Bowel sounds are normal.     Palpations: Abdomen is soft.  Musculoskeletal:     Right lower leg: No edema.     Left lower leg: No edema.     EKG:    EKG Interpretation Date/Time:  Monday September 29 2023 15:40:37 EDT Ventricular Rate:  59 PR Interval:  180 QRS Duration:  130 QT Interval:  450 QTC Calculation: 445 R Axis:   -52  Text Interpretation: EKG 09/29/2023: Normal sinus rhythm at the rate of 59 bpm, left anterior  fascicular block.  IVCD, borderline criteria for LVH.  No significant change from 07/03/2021. Confirmed by Vallery Mcdade, Jagadeesh ([id number]) on 09/29/2023 4:23:07 PM    ASSESSMENT AND PLAN: .      ICD-10-CM   1. Bicuspid aortic valve  Q23.81 EKG 12-Lead    2. Mechanical AVR 27 mm Saint Jude Regent valve and aortic root replacemen 10/16/2021.  Z95.2     3. Non-ischemic cardiomyopathy (HCC)  I42.8     4. Indication present for endocarditis prophylaxis  Z29.89      Assessment & Plan Bicuspid aortic valve, status post aortic valve and root replacement with annual surveillance Status post aortic valve and root replacement on October 16, 2021. Echocardiogram shows ejection fraction at 45%, indicating mildly reduced heart function. No symptoms of heart failure. Current aorta appears healthy despite increased risk for aortic aneurysm. Endocarditis prophylaxis is necessary. Prefers consultation with Dr. Laurence for future surgical interventions. - Continue annual echocardiogram to monitor aortic valve and root replacement and LVEF - Ensure endocarditis prophylaxis is followed  Non-ischemic cardiomyopathy with mildly reduced ejection fraction Non-ischemic cardiomyopathy with ejection fraction at 45%. Previous intolerance to Entresto  due to dizziness, managed with valsartan  80 mg twice daily. Potential to improve heart function by increasing valsartan  dosage, patient will try taking 160 mg twice daily and if tolerated, he will send me a MyChart message.. Carvedilol  is an option for dose adjustment, patient prefers to try to increase valsartan . Monitor blood pressure, especially on days of fatigue, to assess tolerance to medication adjustments.  Suspect some of the fatigue that he is experiencing may be related to untreated sleep apnea. - Increase valsartan  to 160 mg twice daily if tolerated - Monitor blood pressure, especially on days of fatigue  Obstructive sleep apnea Diagnosed in January. Inconsistent use of CPAP  affects energy levels. Encouraged consistent use for improved energy and health. Discussed alternative CPAP mask options for comfort and compliance. - Use CPAP machine consistently - Consider trying a nasal pillow mask for comfort - Contact CPAP supplier for mask options  Hyperlipidemia, well controlled Well controlled with atorvastatin  80 mg daily. Recent labs show LDL at 58, indicating effective management. - Continue atorvastatin  80 mg daily  Follow up: 1 Year  Signed,  Gordy Bergamo, MD, [facility] 09/29/2023, 5:41 PM [facility] [address] [place], [place] [address] Phone: [phone]. Fax:  [phone]

## 2023-09-29 NOTE — Patient Instructions (Signed)
 continue taking 6 mg Monday, Wednesday, Friday, and 3 mg all the other days.  Recheck INR in 8 weeks-Lives in [place], [place]  Mechanical AVR 27 mm Saint Jude Regent valve and aortic root replacemen 10/16/2021 Coumadin  Clinic [phone]

## 2023-09-29 NOTE — Patient Instructions (Signed)
 Medication Instructions:  Your physician recommends that you continue on your current medications as directed. Please refer to the Current Medication list given to you today.  *If you need a refill on your cardiac medications before your next appointment, please call your pharmacy*  Lab Work: none If you have labs (blood work) drawn today and your tests are completely normal, you will receive your results only by: MyChart Message (if you have MyChart) OR A paper copy in the mail If you have any lab test that is abnormal or we need to change your treatment, we will call you to review the results.  Testing/Procedures: none  Follow-Up: At [facility], you and your health needs are our priority.  As part of our continuing mission to provide you with exceptional heart care, our providers are all part of one team.  This team includes your primary Cardiologist (physician) and Advanced Practice Providers or APPs (Physician Assistants and Nurse Practitioners) who all work together to provide you with the care you need, when you need it.  Your next appointment:   12 month(s)  Provider:   Gordy Bergamo, MD    We recommend signing up for the patient portal called MyChart.  Sign up information is provided on this After Visit Summary.  MyChart is used to connect with patients for Virtual Visits (Telemedicine).  Patients are able to view lab/test results, encounter notes, upcoming appointments, etc.  Non-urgent messages can be sent to your provider as well.   To learn more about what you can do with MyChart, go to [email or web address].   Other Instructions

## 2023-09-29 NOTE — Progress Notes (Signed)
 INR 2.6; Please see anticoagulation encounter

## 2023-10-10 ENCOUNTER — Other Ambulatory Visit: Payer: Self-pay

## 2023-10-10 DIAGNOSIS — I5022 Chronic systolic (congestive) heart failure: Secondary | ICD-10-CM

## 2023-10-10 MED ORDER — CARVEDILOL 6.25 MG PO TABS: 6.2500 mg | ORAL_TABLET | Freq: Two times a day (BID) | ORAL | 3 refills | Status: AC

## 2023-11-24 ENCOUNTER — Ambulatory Visit

## 2023-11-26 ENCOUNTER — Ambulatory Visit

## 2023-12-02 ENCOUNTER — Ambulatory Visit: Attending: Cardiology | Admitting: Pharmacist

## 2023-12-02 DIAGNOSIS — Z7901 Long term (current) use of anticoagulants: Secondary | ICD-10-CM | POA: Diagnosis not present

## 2023-12-02 DIAGNOSIS — Z5181 Encounter for therapeutic drug level monitoring: Secondary | ICD-10-CM

## 2023-12-02 DIAGNOSIS — Z952 Presence of prosthetic heart valve: Secondary | ICD-10-CM | POA: Diagnosis not present

## 2023-12-02 LAB — POCT INR: INR: 2.4 (ref 2.0–3.0)

## 2023-12-02 NOTE — Patient Instructions (Signed)
 Description   INR 2.4: Continue taking 6 mg Monday, Wednesday, Friday, and 3 mg all the other days.  Recheck INR in 8 weeks-Lives in [place], [place]  Mechanical AVR 27 mm Saint Jude Regent valve and aortic root replacemen 10/16/2021 Coumadin  Clinic [phone]

## 2023-12-02 NOTE — Progress Notes (Signed)
 Description   INR 2.4: Continue taking 6 mg Monday, Wednesday, Friday, and 3 mg all the other days.  Recheck INR in 8 weeks-Lives in [place], [place]  Mechanical AVR 27 mm Saint Jude Regent valve and aortic root replacemen 10/16/2021 Coumadin  Clinic [phone]

## 2023-12-26 ENCOUNTER — Other Ambulatory Visit: Payer: Self-pay | Admitting: Cardiology

## 2024-01-26 ENCOUNTER — Ambulatory Visit: Attending: Cardiology | Admitting: Pharmacist

## 2024-01-26 ENCOUNTER — Other Ambulatory Visit: Payer: Self-pay

## 2024-01-26 DIAGNOSIS — I5022 Chronic systolic (congestive) heart failure: Secondary | ICD-10-CM

## 2024-01-26 DIAGNOSIS — Z5181 Encounter for therapeutic drug level monitoring: Secondary | ICD-10-CM

## 2024-01-26 DIAGNOSIS — Z952 Presence of prosthetic heart valve: Secondary | ICD-10-CM

## 2024-01-26 LAB — POCT INR: INR: 3.2 — AB (ref 2.0–3.0)

## 2024-01-26 NOTE — Progress Notes (Signed)
 Description   INR 3.2: Take 1/2 tablet today and then continue taking 6 mg Monday, Wednesday, Friday, and 3 mg all the other days.  Recheck INR in 8 weeks-Lives in [place], [place]  Mechanical AVR 27 mm Saint Jude Regent valve and aortic root replacemen 10/16/2021 Coumadin  Clinic [phone]

## 2024-01-26 NOTE — Patient Instructions (Addendum)
 Description   INR 3.2: Take 1/2 tablet today and then continue taking 6 mg Monday, Wednesday, Friday, and 3 mg all the other days.  Recheck INR in 8 weeks-Lives in [place], [place]  Mechanical AVR 27 mm Saint Jude Regent valve and aortic root replacemen 10/16/2021 Coumadin  Clinic [phone]

## 2024-01-29 MED ORDER — VALSARTAN 160 MG PO TABS: 80.0000 mg | ORAL_TABLET | Freq: Two times a day (BID) | ORAL | 2 refills | Status: DC

## 2024-02-03 NOTE — Progress Notes (Unsigned)
 Bruce [name]

## 2024-02-04 ENCOUNTER — Encounter: Payer: Self-pay | Admitting: Adult Health

## 2024-02-04 ENCOUNTER — Ambulatory Visit: Payer: BC Managed Care – PPO | Admitting: Adult Health

## 2024-02-04 VITALS — BP 115/70 | HR 63 | Ht 72.0 in | Wt 224.8 lb

## 2024-02-04 DIAGNOSIS — G4733 Obstructive sleep apnea (adult) (pediatric): Secondary | ICD-10-CM

## 2024-02-21 ENCOUNTER — Other Ambulatory Visit: Payer: Self-pay | Admitting: Cardiology

## 2024-02-21 DIAGNOSIS — Z952 Presence of prosthetic heart valve: Secondary | ICD-10-CM

## 2024-02-21 DIAGNOSIS — Z7901 Long term (current) use of anticoagulants: Secondary | ICD-10-CM

## 2024-02-23 NOTE — Telephone Encounter (Signed)
 Warfarin 6mg  Dx-Mechanical AVR with 27 mm Saint Jude Regent valve and aortic root replacement  Last INR Check-01/26/24 Last OV- 09/29/23

## 2024-03-22 ENCOUNTER — Ambulatory Visit: Admitting: Pharmacist

## 2024-03-22 DIAGNOSIS — I5022 Chronic systolic (congestive) heart failure: Secondary | ICD-10-CM

## 2024-03-22 DIAGNOSIS — Z5181 Encounter for therapeutic drug level monitoring: Secondary | ICD-10-CM | POA: Diagnosis not present

## 2024-03-22 DIAGNOSIS — Z952 Presence of prosthetic heart valve: Secondary | ICD-10-CM

## 2024-03-22 LAB — POCT INR: INR: 2.8 (ref 2.0–3.0)

## 2024-03-22 NOTE — Patient Instructions (Signed)
 Description   INR 2.8: continue taking 6 mg Monday, Wednesday, Friday, and 3 mg all the other days.  Recheck INR in 8 weeks-Lives in [place], [place]  Mechanical AVR 27 mm Saint Jude Regent valve and aortic root replacemen 10/16/2021 Coumadin  Clinic [phone]

## 2024-03-22 NOTE — Progress Notes (Signed)
 Description   INR 2.8: continue taking 6 mg Monday, Wednesday, Friday, and 3 mg all the other days.  Recheck INR in 8 weeks-Lives in [place], [place]  Mechanical AVR 27 mm Saint Jude Regent valve and aortic root replacemen 10/16/2021 Coumadin  Clinic [phone]

## 2024-05-17 ENCOUNTER — Ambulatory Visit

## 2025-02-07 ENCOUNTER — Ambulatory Visit: Admitting: Adult Health
